# Patient Record
Sex: Female | Born: 1935 | Race: White | Hispanic: No | Smoking: Never smoker
Health system: Southern US, Community
[De-identification: ages and names within clinical notes are randomized; demographics above are authoritative.]

## PROBLEM LIST (undated history)

## (undated) DIAGNOSIS — I2510 Atherosclerotic heart disease of native coronary artery without angina pectoris: Secondary | ICD-10-CM

## (undated) DIAGNOSIS — E785 Hyperlipidemia, unspecified: Secondary | ICD-10-CM

## (undated) DIAGNOSIS — I214 Non-ST elevation (NSTEMI) myocardial infarction: Secondary | ICD-10-CM

## (undated) HISTORY — DX: Atherosclerotic heart disease of native coronary artery without angina pectoris: I25.10

## (undated) HISTORY — DX: Non-ST elevation (NSTEMI) myocardial infarction: I21.4

## (undated) HISTORY — DX: Hyperlipidemia, unspecified: E78.5

## (undated) HISTORY — PX: BLADDER SURGERY: SHX569

## (undated) HISTORY — PX: ABDOMINAL HYSTERECTOMY: SHX81

---

## 2014-05-18 DIAGNOSIS — H10023 Other mucopurulent conjunctivitis, bilateral: Secondary | ICD-10-CM | POA: Diagnosis not present

## 2015-02-08 DIAGNOSIS — H2513 Age-related nuclear cataract, bilateral: Secondary | ICD-10-CM | POA: Diagnosis not present

## 2016-02-08 DIAGNOSIS — L570 Actinic keratosis: Secondary | ICD-10-CM | POA: Diagnosis not present

## 2016-02-08 DIAGNOSIS — C44629 Squamous cell carcinoma of skin of left upper limb, including shoulder: Secondary | ICD-10-CM | POA: Diagnosis not present

## 2016-02-15 DIAGNOSIS — H2513 Age-related nuclear cataract, bilateral: Secondary | ICD-10-CM | POA: Diagnosis not present

## 2016-03-26 DIAGNOSIS — J189 Pneumonia, unspecified organism: Secondary | ICD-10-CM | POA: Diagnosis not present

## 2016-03-26 DIAGNOSIS — R062 Wheezing: Secondary | ICD-10-CM | POA: Diagnosis not present

## 2016-03-26 DIAGNOSIS — J0100 Acute maxillary sinusitis, unspecified: Secondary | ICD-10-CM | POA: Diagnosis not present

## 2016-03-26 DIAGNOSIS — R0602 Shortness of breath: Secondary | ICD-10-CM | POA: Diagnosis not present

## 2016-03-29 DIAGNOSIS — R001 Bradycardia, unspecified: Secondary | ICD-10-CM | POA: Diagnosis not present

## 2016-03-29 DIAGNOSIS — I340 Nonrheumatic mitral (valve) insufficiency: Secondary | ICD-10-CM | POA: Diagnosis not present

## 2016-03-29 DIAGNOSIS — I10 Essential (primary) hypertension: Secondary | ICD-10-CM | POA: Diagnosis not present

## 2016-03-29 DIAGNOSIS — I214 Non-ST elevation (NSTEMI) myocardial infarction: Secondary | ICD-10-CM | POA: Diagnosis not present

## 2016-03-29 DIAGNOSIS — Z885 Allergy status to narcotic agent status: Secondary | ICD-10-CM | POA: Diagnosis not present

## 2016-03-29 DIAGNOSIS — E785 Hyperlipidemia, unspecified: Secondary | ICD-10-CM | POA: Diagnosis not present

## 2016-03-29 DIAGNOSIS — K521 Toxic gastroenteritis and colitis: Secondary | ICD-10-CM | POA: Diagnosis not present

## 2016-03-29 DIAGNOSIS — Z85828 Personal history of other malignant neoplasm of skin: Secondary | ICD-10-CM | POA: Diagnosis not present

## 2016-03-29 DIAGNOSIS — Z79899 Other long term (current) drug therapy: Secondary | ICD-10-CM | POA: Diagnosis not present

## 2016-03-29 DIAGNOSIS — I119 Hypertensive heart disease without heart failure: Secondary | ICD-10-CM | POA: Diagnosis not present

## 2016-03-29 DIAGNOSIS — R740 Nonspecific elevation of levels of transaminase and lactic acid dehydrogenase [LDH]: Secondary | ICD-10-CM | POA: Diagnosis present

## 2016-03-29 DIAGNOSIS — R079 Chest pain, unspecified: Secondary | ICD-10-CM | POA: Diagnosis not present

## 2016-03-30 DIAGNOSIS — I10 Essential (primary) hypertension: Secondary | ICD-10-CM

## 2016-03-30 DIAGNOSIS — E785 Hyperlipidemia, unspecified: Secondary | ICD-10-CM

## 2016-03-31 DIAGNOSIS — I517 Cardiomegaly: Secondary | ICD-10-CM | POA: Diagnosis not present

## 2016-03-31 DIAGNOSIS — R42 Dizziness and giddiness: Secondary | ICD-10-CM | POA: Diagnosis not present

## 2016-03-31 DIAGNOSIS — J9811 Atelectasis: Secondary | ICD-10-CM | POA: Diagnosis not present

## 2016-03-31 DIAGNOSIS — I2510 Atherosclerotic heart disease of native coronary artery without angina pectoris: Secondary | ICD-10-CM | POA: Diagnosis not present

## 2016-03-31 DIAGNOSIS — E785 Hyperlipidemia, unspecified: Secondary | ICD-10-CM | POA: Diagnosis not present

## 2016-03-31 DIAGNOSIS — I119 Hypertensive heart disease without heart failure: Secondary | ICD-10-CM | POA: Diagnosis not present

## 2016-03-31 DIAGNOSIS — I252 Old myocardial infarction: Secondary | ICD-10-CM | POA: Diagnosis not present

## 2016-03-31 DIAGNOSIS — I440 Atrioventricular block, first degree: Secondary | ICD-10-CM | POA: Diagnosis not present

## 2016-03-31 DIAGNOSIS — J189 Pneumonia, unspecified organism: Secondary | ICD-10-CM | POA: Diagnosis not present

## 2016-03-31 DIAGNOSIS — R079 Chest pain, unspecified: Secondary | ICD-10-CM | POA: Diagnosis not present

## 2016-03-31 DIAGNOSIS — I10 Essential (primary) hypertension: Secondary | ICD-10-CM | POA: Diagnosis not present

## 2016-03-31 DIAGNOSIS — R9431 Abnormal electrocardiogram [ECG] [EKG]: Secondary | ICD-10-CM | POA: Diagnosis not present

## 2016-03-31 DIAGNOSIS — I214 Non-ST elevation (NSTEMI) myocardial infarction: Secondary | ICD-10-CM | POA: Diagnosis not present

## 2016-03-31 DIAGNOSIS — E784 Other hyperlipidemia: Secondary | ICD-10-CM | POA: Diagnosis not present

## 2016-03-31 DIAGNOSIS — I25118 Atherosclerotic heart disease of native coronary artery with other forms of angina pectoris: Secondary | ICD-10-CM | POA: Diagnosis not present

## 2016-03-31 DIAGNOSIS — I340 Nonrheumatic mitral (valve) insufficiency: Secondary | ICD-10-CM | POA: Diagnosis not present

## 2016-04-28 DIAGNOSIS — I2510 Atherosclerotic heart disease of native coronary artery without angina pectoris: Secondary | ICD-10-CM | POA: Diagnosis not present

## 2016-04-28 DIAGNOSIS — E785 Hyperlipidemia, unspecified: Secondary | ICD-10-CM | POA: Diagnosis not present

## 2016-04-28 DIAGNOSIS — I214 Non-ST elevation (NSTEMI) myocardial infarction: Secondary | ICD-10-CM | POA: Diagnosis not present

## 2016-06-15 DIAGNOSIS — H10021 Other mucopurulent conjunctivitis, right eye: Secondary | ICD-10-CM | POA: Diagnosis not present

## 2016-07-03 DIAGNOSIS — E785 Hyperlipidemia, unspecified: Secondary | ICD-10-CM | POA: Diagnosis not present

## 2016-07-03 DIAGNOSIS — I214 Non-ST elevation (NSTEMI) myocardial infarction: Secondary | ICD-10-CM | POA: Diagnosis not present

## 2016-07-03 DIAGNOSIS — I2510 Atherosclerotic heart disease of native coronary artery without angina pectoris: Secondary | ICD-10-CM | POA: Diagnosis not present

## 2016-07-05 DIAGNOSIS — I119 Hypertensive heart disease without heart failure: Secondary | ICD-10-CM | POA: Diagnosis not present

## 2016-07-05 DIAGNOSIS — E782 Mixed hyperlipidemia: Secondary | ICD-10-CM | POA: Diagnosis not present

## 2016-08-22 DIAGNOSIS — Z79899 Other long term (current) drug therapy: Secondary | ICD-10-CM | POA: Diagnosis not present

## 2016-08-22 DIAGNOSIS — I119 Hypertensive heart disease without heart failure: Secondary | ICD-10-CM | POA: Diagnosis not present

## 2016-08-22 DIAGNOSIS — E782 Mixed hyperlipidemia: Secondary | ICD-10-CM | POA: Diagnosis not present

## 2016-11-01 ENCOUNTER — Encounter: Payer: Self-pay | Admitting: Cardiology

## 2016-11-16 ENCOUNTER — Other Ambulatory Visit: Payer: Self-pay

## 2016-11-16 ENCOUNTER — Encounter: Payer: Self-pay | Admitting: Cardiology

## 2016-11-16 DIAGNOSIS — E785 Hyperlipidemia, unspecified: Secondary | ICD-10-CM

## 2016-11-16 DIAGNOSIS — I214 Non-ST elevation (NSTEMI) myocardial infarction: Secondary | ICD-10-CM

## 2016-11-16 DIAGNOSIS — I2510 Atherosclerotic heart disease of native coronary artery without angina pectoris: Secondary | ICD-10-CM

## 2016-11-16 DIAGNOSIS — I252 Old myocardial infarction: Secondary | ICD-10-CM | POA: Insufficient documentation

## 2016-11-16 DIAGNOSIS — I25110 Atherosclerotic heart disease of native coronary artery with unstable angina pectoris: Secondary | ICD-10-CM | POA: Insufficient documentation

## 2016-11-16 HISTORY — DX: Hyperlipidemia, unspecified: E78.5

## 2016-11-16 HISTORY — DX: Non-ST elevation (NSTEMI) myocardial infarction: I21.4

## 2016-11-16 HISTORY — DX: Atherosclerotic heart disease of native coronary artery without angina pectoris: I25.10

## 2016-11-20 ENCOUNTER — Encounter: Payer: Self-pay | Admitting: Cardiology

## 2016-11-20 ENCOUNTER — Ambulatory Visit (INDEPENDENT_AMBULATORY_CARE_PROVIDER_SITE_OTHER): Payer: Medicare Other | Admitting: Cardiology

## 2016-11-20 VITALS — BP 136/72 | HR 59 | Ht 62.0 in | Wt 135.0 lb

## 2016-11-20 DIAGNOSIS — E785 Hyperlipidemia, unspecified: Secondary | ICD-10-CM

## 2016-11-20 DIAGNOSIS — I25110 Atherosclerotic heart disease of native coronary artery with unstable angina pectoris: Secondary | ICD-10-CM

## 2016-11-20 DIAGNOSIS — I959 Hypotension, unspecified: Secondary | ICD-10-CM | POA: Diagnosis not present

## 2016-11-20 NOTE — Progress Notes (Signed)
Cardiology Office Note:    Date:  11/20/2016   ID:  Shannon Flowers [place], [place] 02/11/1927, MRN [id number]  PCP:  Melony Overly, MD  Cardiologist:  Shirlee More, MD    Referring MD: No ref. provider found    ASSESSMENT:    1. Coronary artery disease involving native coronary artery of native heart with unstable angina pectoris ([place])   2. Dyslipidemia   3. Hypotension, unspecified hypotension type    PLAN:    In order of problems listed above:  1. Stable clinically continue current treatment but unfortunately are not except dual antiplatelet therapy or statin treatment. I asked her she changes her mind to be in touch with me and offered to give her a low-dose low intensity statin 2 days a week to mitigate her concerns of side effects. 2. Poorly treated 3. Hypotension, resolved   Next appointment: 6 months   Medication Adjustments/Labs and Tests Ordered: Current medicines are reviewed at length with the patient today.  Concerns regarding medicines are outlined above.  Orders Placed This Encounter  Procedures  . EKG 12-Lead   No orders of the defined types were placed in this encounter.   Chief Complaint  Patient presents with  . Follow-up    Routine flup appt for CAD    History of Present Illness:    Shannon Flowers is a 83 y.o. female with a hx of CAD  hypertension and recent symptomatic hypotension ast seen within the last year. With blood pressure as low as 70 systolic her ACE inhibitor was stopped and she's had no recurrent hypotension or weakness. At that time she was having severe diarrhea. I discussed lipid-lowering therapy she adamantly refuses except pharmaceutical interventions. I asked her to consider dual antiplatelet therapy she declines that she is pleased with the quality of her life is having no angina shortness of breath palpitations syncope or TIA as lost her husband in the last month and seems to be somewhat fatalistic at this  time.. If symptomatic she reconsider these endeavors Compliance with diet, lifestyle and medications: Yes Past Medical History:  Diagnosis Date  . ASHD (arteriosclerotic heart disease) 11/16/2016  . Dyslipidemia 11/16/2016  . Non-ST elevation (NSTEMI) myocardial infarction [facility]) 11/16/2016    Past Surgical History:  Procedure Laterality Date  . ABDOMINAL HYSTERECTOMY    . CORONARY ANGIOPLASTY WITH STENT PLACEMENT      Current Medications: Current Meds  Medication Sig  . albuterol (PROAIR HFA) 108 (90 Base) MCG/ACT inhaler Inhale 1 puff into the lungs daily as needed for wheezing.   Marland Kitchen aspirin 81 MG chewable tablet Chew 81 mg by mouth daily.   . isosorbide mononitrate (IMDUR) 30 MG 24 hr tablet Take 30 mg by mouth daily.   . nitroGLYCERIN (NITROSTAT) 0.4 MG SL tablet Place 0.4 mg under the tongue every 5 (five) minutes as needed for chest pain.   . [DISCONTINUED] furosemide (LASIX) 20 MG tablet Take 20 mg by mouth 2 (two) times daily.  . [DISCONTINUED] metoprolol tartrate (LOPRESSOR) 25 MG tablet Take 25 mg by mouth 2 (two) times daily.     Allergies:   Codeine and Levofloxacin   Social History   Social History  . Marital status: Widowed    Spouse name: N/A  . Number of children: N/A  . Years of education: N/A   Social History Main Topics  . Smoking status: Never Smoker  . Smokeless tobacco: Never Used  . Alcohol use No  . Drug use: No  .  Sexual activity: Not Asked   Other Topics Concern  . None   Social History Narrative  . None     Family History: The patient's family history includes Stroke in her father. ROS:   Please see the history of present illness.    All other systems reviewed and are negative.  EKGs/Labs/Other Studies Reviewed:    The following studies were reviewed today:  EKG:  EKG ordered today.  The ekg ordered today demonstrates [place] stable pattern normal EKG  Recent Labs:Requested from her PCP No results found for requested labs within last  8760 hours.  Recent Lipid Panel No results found for: CHOL, TRIG, HDL, CHOLHDL, VLDL, LDLCALC, LDLDIRECT  Physical Exam:    VS:  BP 136/72   Pulse (!) 59   Ht 5\' 2"  (1.575 m)   Wt 135 lb (61.2 kg)   SpO2 97%   BMI 24.69 kg/m     Wt Readings from Last 3 Encounters:  11/20/16 135 lb (61.2 kg)     GEN:  Well nourished, well developed in no acute distress HEENT: Normal NECK: No JVD; No carotid bruits LYMPHATICS: No lymphadenopathy CARDIAC: RRR, no murmurs, rubs, gallops RESPIRATORY:  Clear to auscultation without rales, wheezing or rhonchi  ABDOMEN: Soft, non-tender, non-distended MUSCULOSKELETAL:  No edema; No deformity  SKIN: Warm and dry NEUROLOGIC:  Alert and oriented x 3 PSYCHIATRIC:  Normal affect    Signed, Shirlee More, MD  11/20/2016 1:00 PM    [facility]

## 2016-11-20 NOTE — Patient Instructions (Signed)

## 2016-11-30 DIAGNOSIS — Z0000 Encounter for general adult medical examination without abnormal findings: Secondary | ICD-10-CM | POA: Diagnosis not present

## 2016-11-30 DIAGNOSIS — E663 Overweight: Secondary | ICD-10-CM | POA: Diagnosis not present

## 2016-11-30 DIAGNOSIS — E785 Hyperlipidemia, unspecified: Secondary | ICD-10-CM | POA: Diagnosis not present

## 2016-11-30 DIAGNOSIS — I119 Hypertensive heart disease without heart failure: Secondary | ICD-10-CM | POA: Diagnosis not present

## 2016-11-30 DIAGNOSIS — Z136 Encounter for screening for cardiovascular disorders: Secondary | ICD-10-CM | POA: Diagnosis not present

## 2016-11-30 DIAGNOSIS — R739 Hyperglycemia, unspecified: Secondary | ICD-10-CM | POA: Diagnosis not present

## 2016-12-04 ENCOUNTER — Other Ambulatory Visit: Payer: Self-pay

## 2016-12-04 NOTE — Telephone Encounter (Signed)
Patient called to inform of seeing a new primary doctor and that he started the patient on levothyroxine and fenofibrate. Patient was concerned and wanted Dr. Joya Gaskins opinion. Advised that Dr. Bettina Gavia was out of the office this week. Looked up on UpToDate. Advised patient that there are no interactions between these medications and her current medications. Patient still concerned over why she was prescribed these medications. Advised patient that she would have to call the doctor's office who prescribed the medication as we could not speak to that. The patient preceded to state that she has concerns over this new doctor and is unsure if she trusts him. Advised patient she could get a second opinion and that she could also call the doctor's office and ask if the doctor mentioned in the note or if there was lab work drawn to indicate a reason for starting the new medications. Patient verbalized understanding.

## 2016-12-05 DIAGNOSIS — Z1231 Encounter for screening mammogram for malignant neoplasm of breast: Secondary | ICD-10-CM | POA: Diagnosis not present

## 2016-12-09 DIAGNOSIS — I252 Old myocardial infarction: Secondary | ICD-10-CM | POA: Diagnosis not present

## 2016-12-09 DIAGNOSIS — I2510 Atherosclerotic heart disease of native coronary artery without angina pectoris: Secondary | ICD-10-CM | POA: Diagnosis not present

## 2016-12-09 DIAGNOSIS — R0789 Other chest pain: Secondary | ICD-10-CM | POA: Diagnosis not present

## 2016-12-09 DIAGNOSIS — R079 Chest pain, unspecified: Secondary | ICD-10-CM | POA: Diagnosis not present

## 2017-01-02 DIAGNOSIS — L570 Actinic keratosis: Secondary | ICD-10-CM | POA: Diagnosis not present

## 2017-01-02 DIAGNOSIS — L821 Other seborrheic keratosis: Secondary | ICD-10-CM | POA: Diagnosis not present

## 2017-01-02 DIAGNOSIS — R233 Spontaneous ecchymoses: Secondary | ICD-10-CM | POA: Diagnosis not present

## 2017-03-13 DIAGNOSIS — H2513 Age-related nuclear cataract, bilateral: Secondary | ICD-10-CM | POA: Diagnosis not present

## 2017-03-13 DIAGNOSIS — E119 Type 2 diabetes mellitus without complications: Secondary | ICD-10-CM | POA: Diagnosis not present

## 2017-04-03 ENCOUNTER — Other Ambulatory Visit: Payer: Self-pay

## 2017-04-03 MED ORDER — ISOSORBIDE MONONITRATE ER 30 MG PO TB24: 30.0000 mg | ORAL_TABLET | Freq: Every day | ORAL | 3 refills | Status: DC

## 2017-05-22 NOTE — Progress Notes (Signed)
Cardiology Office Note:    Date:  05/23/2017   ID:  [place], [place] January 02, 1927, MRN [id number]  PCP:  No primary care provider on file.  Cardiologist:  Shirlee More, MD    Referring MD: Melony Overly, MD    ASSESSMENT:    1. Coronary artery disease involving native coronary artery of native heart with unstable angina pectoris ([place])   2. Dyslipidemia   3. Other specified hypotension    PLAN:    In order of problems listed above:  1. Stable course she will continue medical therapy of aspirin beta-blocker and after long discussion he agrees to accept lipid-lowering therapy but will not take a statin and initiate treatment with Zetia.  Baseline liver profile CMP checked today.  I have reviewed her previous records at the time of coronary angiography in November 2018 she had multivessel CAD and was judged to be a poor surgical candidate and advised medical therapy.  At this time I would not change our approach.  We will consider a follow-up ischemia evaluation at the time of her next visit. 2. Initiate lipid-lowering therapy 3. No clinical recurrence   Next appointment: 6 months   Medication Adjustments/Labs and Tests Ordered: Current medicines are reviewed at length with the patient today.  Concerns regarding medicines are outlined above.  Orders Placed This Encounter  Procedures  . CBC w/Diff/Platelet  . Comprehensive Metabolic Panel (CMET)  . Lipid Profile  . EKG 12-Lead   Meds ordered this encounter  Medications  . ezetimibe (ZETIA) 10 MG tablet    Sig: Take 1 tablet (10 mg total) by mouth daily.    Dispense:  30 tablet    Refill:  3    Chief Complaint  Patient presents with  . Follow-up    History of Present Illness:   CAD, hypertension, hyperlipidemia and hypotension Shannon Flowers is a 84 y.o. female with a hx of CAD and non STEMI November 2018, hypertension, hyperlipidemia and hypotension last seen in July 2018. 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  She is pleased with the quality of her life compliant with her medications but has a strong personal belief that she cannot take a statin she refuses even a low-dose of a low intensity statin 2 days a week but after discussion of the benefits of lipid-lowering therapy agrees to take Zetia. Past Medical History:  Diagnosis Date  . ASHD (arteriosclerotic heart disease) 11/16/2016  . Dyslipidemia 11/16/2016  . Non-ST elevation (NSTEMI) myocardial infarction [facility]) 11/16/2016    Past Surgical History:  Procedure Laterality Date  . ABDOMINAL HYSTERECTOMY    . CORONARY ANGIOPLASTY WITH STENT PLACEMENT      Current Medications: Current Meds  Medication Sig  . albuterol (PROAIR HFA) 108 (90 Base) MCG/ACT inhaler Inhale 1 puff into the lungs daily as needed for wheezing.   Marland Kitchen aspirin 81 MG chewable tablet Chew 81 mg by mouth daily.   . carvedilol (COREG) 6.25 MG tablet Take 6.25 mg by mouth 2 (two) times daily.   . isosorbide mononitrate (IMDUR) 30 MG 24 hr tablet Take 1 tablet (30 mg total) by mouth daily.  . nitroGLYCERIN (NITROSTAT) 0.4 MG SL tablet Place 0.4 mg under the  tongue every 5 (five) minutes as needed for chest pain.      Allergies:   Codeine and Levofloxacin   Social History   Socioeconomic History  . Marital status: Widowed    Spouse name: None  . Number of children: None  . Years of education: None  . Highest education level: None  Social Needs  . Financial resource strain: None  . Food insecurity - worry: None  . Food insecurity - inability: None  . Transportation needs - medical: None  . Transportation needs - non-medical: None  Occupational History  . None  Tobacco  Use  . Smoking status: Never Smoker  . Smokeless tobacco: Never Used  Substance and Sexual Activity  . Alcohol use: No  . Drug use: No  . Sexual activity: None  Other Topics Concern  . None  Social History Narrative  . None     Family History: The patient's family history includes Stroke in her father. ROS:   Please see the history of present illness.    All other systems reviewed and are negative.  EKGs/Labs/Other Studies Reviewed:    The following studies were reviewed today:  EKG:  EKG ordered today.  The ekg ordered today demonstrates sinus rhythm left anterior hemiblock  Recent Labs: No results found for requested labs within last 8760 hours.  Recent Lipid Panel No results found for: CHOL, TRIG, HDL, CHOLHDL, VLDL, LDLCALC, LDLDIRECT  Physical Exam:    VS:  BP 116/60   Pulse (!) 56   Ht 5\' 2"  (1.575 m)   Wt 131 lb 12.8 oz (59.8 kg)   SpO2 96%   BMI 24.11 kg/m     Wt Readings from Last 3 Encounters:  05/23/17 131 lb 12.8 oz (59.8 kg)  11/20/16 135 lb (61.2 kg)     GEN: She appears her age  well nourished, well developed in no acute distress HEENT: Normal NECK: No JVD; No carotid bruits LYMPHATICS: No lymphadenopathy CARDIAC:  RRR, no murmurs, rubs, gallops RESPIRATORY:  Clear to auscultation without rales, wheezing or rhonchi  ABDOMEN: Soft, non-tender, non-distended MUSCULOSKELETAL:  No edema; No deformity  SKIN: Warm and dry NEUROLOGIC:  Alert and oriented x 3 PSYCHIATRIC:  Normal affect    Signed, Shirlee More, MD  05/23/2017 12:48 PM    [facility]

## 2017-05-23 ENCOUNTER — Encounter: Payer: Self-pay | Admitting: Cardiology

## 2017-05-23 ENCOUNTER — Ambulatory Visit (INDEPENDENT_AMBULATORY_CARE_PROVIDER_SITE_OTHER): Payer: Medicare Other | Admitting: Cardiology

## 2017-05-23 VITALS — BP 116/60 | HR 56 | Ht 62.0 in | Wt 131.8 lb

## 2017-05-23 DIAGNOSIS — I25110 Atherosclerotic heart disease of native coronary artery with unstable angina pectoris: Secondary | ICD-10-CM | POA: Diagnosis not present

## 2017-05-23 DIAGNOSIS — I9589 Other hypotension: Secondary | ICD-10-CM | POA: Diagnosis not present

## 2017-05-23 DIAGNOSIS — E785 Hyperlipidemia, unspecified: Secondary | ICD-10-CM

## 2017-05-23 MED ORDER — EZETIMIBE 10 MG PO TABS: 10.0000 mg | ORAL_TABLET | Freq: Every day | ORAL | 3 refills | Status: DC

## 2017-05-23 NOTE — Patient Instructions (Signed)
Medication Instructions:  Your physician has recommended you make the following change in your medication:  START zetia 10 mg daily  Labwork: Your physician recommends that you have the following labs drawn: lipids, CMP and CBC  Testing/Procedures: None  Follow-Up: Your physician recommends that you schedule a follow-up appointment in: 6 months  Any Other Special Instructions Will Be Listed Below (If Applicable).     If you need a refill on your cardiac medications before your next appointment, please call your pharmacy.   [place], RN, BSN   Ezetimibe Tablets What is this medicine? EZETIMIBE (ez ET i mibe) blocks the absorption of cholesterol from the stomach. It can help lower blood cholesterol for patients who are at risk of getting heart disease or a stroke. It is only for patients whose cholesterol level is not controlled by diet. This medicine may be used for other purposes; ask your health care provider or pharmacist if you have questions. COMMON BRAND NAME(S): Zetia What should I tell my health care provider before I take this medicine? They need to know if you have any of these conditions: -liver disease -an unusual or allergic reaction to ezetimibe, medicines, foods, dyes, or preservatives -pregnant or trying to get pregnant -breast-feeding How should I use this medicine? Take this medicine by mouth with a glass of water. Follow the directions on the prescription label. This medicine can be taken with or without food. Take your doses at regular intervals. Do not take your medicine more often than directed. Talk to your pediatrician regarding the use of this medicine in children. Special care may be needed. Overdosage: If you think you have taken too much of this medicine contact a poison control center or emergency room at once. NOTE: This medicine is only for you. Do not share this medicine with others. What if I miss a dose? If you miss a dose,  take it as soon as you can. If it is almost time for your next dose, take only that dose. Do not take double or extra doses. What may interact with this medicine? Do not take this medicine with any of the following medications: -fenofibrate -gemfibrozil This medicine may also interact with the following medications: -antacids -cyclosporine -herbal medicines like red yeast rice -other medicines to lower cholesterol or triglycerides This list may not describe all possible interactions. Give your health care provider a list of all the medicines, herbs, non-prescription drugs, or dietary supplements you use. Also tell them if you smoke, drink alcohol, or use illegal drugs. Some items may interact with your medicine. What should I watch for while using this medicine? Visit your doctor or health care professional for regular checks on your progress. You will need to have your cholesterol levels checked. If you are also taking some other cholesterol medicines, you will also need to have tests to make sure your liver is working properly. Tell your doctor or health care professional if you get any unexplained muscle pain, tenderness, or weakness, especially if you also have a fever and tiredness. You need to follow a low-cholesterol, low-fat diet while you are taking this medicine. This will decrease your risk of getting heart and blood vessel disease. Exercising and avoiding alcohol and smoking can also help. Ask your doctor or dietician for advice. What side effects may I notice from receiving this medicine? Side effects that you should report to your doctor or health care professional as soon as possible: -allergic reactions like skin rash,  itching or hives, swelling of the face, lips, or tongue -dark yellow or brown urine -unusually weak or tired -yellowing of the skin or eyes Side effects that usually do not require medical attention (report to your doctor or health care professional if they  continue or are bothersome): -diarrhea -dizziness -headache -stomach upset or pain This list may not describe all possible side effects. Call your doctor for medical advice about side effects. You may report side effects to FDA at 1-800-FDA-1088. Where should I keep my medicine? Keep out of the reach of children. Store at room temperature between 15 and 30 degrees C (59 and 86 degrees F). Protect from moisture. Keep container tightly closed. Throw away any unused medicine after the expiration date. NOTE: This sheet is a summary. It may not cover all possible information. If you have questions about this medicine, talk to your doctor, pharmacist, or health care provider.  2018 Elsevier/Gold Standard (2011-10-30 15:39:09)

## 2017-05-24 LAB — COMPREHENSIVE METABOLIC PANEL
ALT: 13 IU/L (ref 0–32)
AST: 20 IU/L (ref 0–40)
Albumin/Globulin Ratio: 1.7 (ref 1.2–2.2)
Albumin: 4.5 g/dL (ref 3.5–4.7)
Alkaline Phosphatase: 55 IU/L (ref 39–117)
BILIRUBIN TOTAL: 0.7 mg/dL (ref 0.0–1.2)
BUN / CREAT RATIO: 24 (ref 12–28)
BUN: 20 mg/dL (ref 8–27)
CO2: 25 mmol/L (ref 20–29)
CREATININE: 0.82 mg/dL (ref 0.57–1.00)
Calcium: 9.9 mg/dL (ref 8.7–10.3)
Chloride: 102 mmol/L (ref 96–106)
GFR, EST AFRICAN AMERICAN: 74 mL/min/{1.73_m2} (ref >59)
GFR, EST NON AFRICAN AMERICAN: 64 mL/min/{1.73_m2} (ref >59)
GLUCOSE: 109 mg/dL — AB (ref 65–99)
Globulin, Total: 2.6 g/dL (ref 1.5–4.5)
Potassium: 4.6 mmol/L (ref 3.5–5.2)
Sodium: 139 mmol/L (ref 134–144)
TOTAL PROTEIN: 7.1 g/dL (ref 6.0–8.5)

## 2017-05-24 LAB — CBC WITH DIFFERENTIAL/PLATELET
BASOS ABS: 0 10*3/uL (ref 0.0–0.2)
BASOS: 0 %
EOS (ABSOLUTE): 0.2 10*3/uL (ref 0.0–0.4)
Eos: 3 %
Hematocrit: 41.7 % (ref 34.0–46.6)
Hemoglobin: 14.1 g/dL (ref 11.1–15.9)
IMMATURE GRANS (ABS): 0 10*3/uL (ref 0.0–0.1)
Immature Granulocytes: 0 %
Lymphocytes Absolute: 2.5 10*3/uL (ref 0.7–3.1)
Lymphs: 40 %
MCH: 31.6 pg (ref 26.6–33.0)
MCHC: 33.8 g/dL (ref 31.5–35.7)
MCV: 94 fL (ref 79–97)
MONOCYTES: 13 %
Monocytes Absolute: 0.8 10*3/uL (ref 0.1–0.9)
NEUTROS ABS: 2.8 10*3/uL (ref 1.4–7.0)
Neutrophils: 44 %
Platelets: 283 10*3/uL (ref 150–379)
RBC: 4.46 x10E6/uL (ref 3.77–5.28)
RDW: 15.2 % (ref 12.3–15.4)
WBC: 6.2 10*3/uL (ref 3.4–10.8)

## 2017-05-24 LAB — LIPID PANEL
CHOL/HDL RATIO: 4.8 ratio — AB (ref 0.0–4.4)
Cholesterol, Total: 169 mg/dL (ref 100–199)
HDL: 35 mg/dL — ABNORMAL LOW (ref >39)
LDL CALC: 82 mg/dL (ref 0–99)
Triglycerides: 261 mg/dL — ABNORMAL HIGH (ref 0–149)
VLDL CHOLESTEROL CAL: 52 mg/dL — AB (ref 5–40)

## 2017-06-05 ENCOUNTER — Other Ambulatory Visit: Payer: Self-pay

## 2017-06-05 MED ORDER — CARVEDILOL 6.25 MG PO TABS: 6.2500 mg | ORAL_TABLET | Freq: Two times a day (BID) | ORAL | 3 refills | Status: DC

## 2017-07-18 DIAGNOSIS — R509 Fever, unspecified: Secondary | ICD-10-CM | POA: Diagnosis not present

## 2017-07-18 DIAGNOSIS — J111 Influenza due to unidentified influenza virus with other respiratory manifestations: Secondary | ICD-10-CM | POA: Diagnosis not present

## 2017-07-18 DIAGNOSIS — M791 Myalgia, unspecified site: Secondary | ICD-10-CM | POA: Diagnosis not present

## 2017-08-07 ENCOUNTER — Emergency Department (HOSPITAL_BASED_OUTPATIENT_CLINIC_OR_DEPARTMENT_OTHER)
Admission: EM | Admit: 2017-08-07 | Discharge: 2017-08-07 | Disposition: A | Discharge status: Home / Self Care | Payer: Medicare Other | Attending: Emergency Medicine | Admitting: Emergency Medicine

## 2017-08-07 ENCOUNTER — Other Ambulatory Visit: Payer: Self-pay

## 2017-08-07 ENCOUNTER — Emergency Department (HOSPITAL_BASED_OUTPATIENT_CLINIC_OR_DEPARTMENT_OTHER): Payer: Medicare Other

## 2017-08-07 ENCOUNTER — Telehealth: Payer: Self-pay

## 2017-08-07 ENCOUNTER — Encounter (HOSPITAL_BASED_OUTPATIENT_CLINIC_OR_DEPARTMENT_OTHER): Payer: Self-pay | Admitting: *Deleted

## 2017-08-07 ENCOUNTER — Ambulatory Visit (HOSPITAL_BASED_OUTPATIENT_CLINIC_OR_DEPARTMENT_OTHER): Admission: RE | Admit: 2017-08-07 | Payer: Medicare Other | Source: Ambulatory Visit

## 2017-08-07 DIAGNOSIS — R1110 Vomiting, unspecified: Secondary | ICD-10-CM | POA: Diagnosis not present

## 2017-08-07 DIAGNOSIS — W540XXA Bitten by dog, initial encounter: Secondary | ICD-10-CM | POA: Insufficient documentation

## 2017-08-07 DIAGNOSIS — Y9389 Activity, other specified: Secondary | ICD-10-CM | POA: Diagnosis not present

## 2017-08-07 DIAGNOSIS — R079 Chest pain, unspecified: Secondary | ICD-10-CM | POA: Diagnosis not present

## 2017-08-07 DIAGNOSIS — R072 Precordial pain: Secondary | ICD-10-CM | POA: Diagnosis not present

## 2017-08-07 DIAGNOSIS — Y999 Unspecified external cause status: Secondary | ICD-10-CM | POA: Diagnosis not present

## 2017-08-07 DIAGNOSIS — Y929 Unspecified place or not applicable: Secondary | ICD-10-CM | POA: Diagnosis not present

## 2017-08-07 DIAGNOSIS — Z7982 Long term (current) use of aspirin: Secondary | ICD-10-CM | POA: Insufficient documentation

## 2017-08-07 DIAGNOSIS — S60512A Abrasion of left hand, initial encounter: Secondary | ICD-10-CM | POA: Insufficient documentation

## 2017-08-07 DIAGNOSIS — I2510 Atherosclerotic heart disease of native coronary artery without angina pectoris: Secondary | ICD-10-CM | POA: Diagnosis not present

## 2017-08-07 DIAGNOSIS — S6992XA Unspecified injury of left wrist, hand and finger(s), initial encounter: Secondary | ICD-10-CM | POA: Diagnosis present

## 2017-08-07 DIAGNOSIS — Z79899 Other long term (current) drug therapy: Secondary | ICD-10-CM | POA: Insufficient documentation

## 2017-08-07 DIAGNOSIS — R0789 Other chest pain: Secondary | ICD-10-CM | POA: Diagnosis not present

## 2017-08-07 DIAGNOSIS — R197 Diarrhea, unspecified: Secondary | ICD-10-CM | POA: Diagnosis not present

## 2017-08-07 LAB — BASIC METABOLIC PANEL
Anion gap: 9 (ref 5–15)
BUN: 25 mg/dL — AB (ref 6–20)
CHLORIDE: 106 mmol/L (ref 101–111)
CO2: 21 mmol/L — AB (ref 22–32)
CREATININE: 0.88 mg/dL (ref 0.44–1.00)
Calcium: 9.3 mg/dL (ref 8.9–10.3)
GFR calc Af Amer: >60 mL/min (ref >60)
GFR calc non Af Amer: 57 mL/min — ABNORMAL LOW (ref >60)
Glucose, Bld: 167 mg/dL — ABNORMAL HIGH (ref 65–99)
Potassium: 3.9 mmol/L (ref 3.5–5.1)
Sodium: 136 mmol/L (ref 135–145)

## 2017-08-07 LAB — CBC WITH DIFFERENTIAL/PLATELET
Basophils Absolute: 0 10*3/uL (ref 0.0–0.1)
Basophils Relative: 0 %
EOS ABS: 0.1 10*3/uL (ref 0.0–0.7)
EOS PCT: 2 %
HCT: 39.8 % (ref 36.0–46.0)
Hemoglobin: 13.8 g/dL (ref 12.0–15.0)
LYMPHS ABS: 1.7 10*3/uL (ref 0.7–4.0)
Lymphocytes Relative: 21 %
MCH: 32.5 pg (ref 26.0–34.0)
MCHC: 34.7 g/dL (ref 30.0–36.0)
MCV: 93.9 fL (ref 78.0–100.0)
MONO ABS: 0.7 10*3/uL (ref 0.1–1.0)
MONOS PCT: 9 %
Neutro Abs: 5.7 10*3/uL (ref 1.7–7.7)
Neutrophils Relative %: 68 %
PLATELETS: 267 10*3/uL (ref 150–400)
RBC: 4.24 MIL/uL (ref 3.87–5.11)
RDW: 14.2 % (ref 11.5–15.5)
WBC: 8.3 10*3/uL (ref 4.0–10.5)

## 2017-08-07 LAB — TROPONIN I: Troponin I: <0.03 ng/mL (ref <0.03)

## 2017-08-07 NOTE — Discharge Instructions (Signed)
You were evaluated in the emergency department for a bad feeling in your chest.  We ran an EKG and x-ray and lab work and did not find an obvious cause of your symptoms.  You should continue your regular medications and follow-up with your doctor.  If you have any concerns or worsening symptoms she should return to the emergency department.

## 2017-08-07 NOTE — ED Provider Notes (Signed)
[place] EMERGENCY DEPARTMENT Provider Note   CSN: [id number] Arrival date & time: 08/07/17  1750     History   Chief Complaint Chief Complaint  Patient presents with  . Chest Pain    HPI Shannon Flowers is a 84 y.o. female.  She is here with a complaint of some chest discomfort that started about 3 PM.  It began at 10 AM when she was bit in the left hand by her dog.  She states that caused her to be very upset throughout the day.  Around 3 PM she said she felt unwell in her chest.  She checked her blood pressure and found it to be 77/40.  Later her blood pressure improved somewhat and they talked to a nurse who instructed her to try nitroglycerin.  She is not sure if the nitroglycerin helped but the discomfort has seemed to resolve and lasted about an hour.  She never called it pain and she is clear on that.  She does state however that the last time she had a heart attack she had a similar sensation that was never pain.  She said that was about 2 years ago.  There was no shortness of breath or sweatiness no nausea.  She is not having any discomfort right now.  As far as the dog bite goes is complaining of minimal pain at the site, she states her tetanus is up-to-date and the dog is up-to-date on his immunizations.  She cleaned the area with peroxide and put a dressing on it.  The history is provided by the patient.  Chest Pain   This is a new problem. The current episode started 3 to 5 hours ago. The problem occurs constantly. The problem has been resolved. The pain is associated with an emotional upset. The pain is present in the substernal region. The patient is experiencing no pain (uncomfortable feeling). The pain does not radiate. Duration of episode(s) is 1 hour. Associated symptoms include vomiting. Pertinent negatives include no abdominal pain, no back pain, no cough, no diaphoresis, no fever, no headaches, no hemoptysis, no nausea, no palpitations and no  shortness of breath. She has tried nitroglycerin for the symptoms. Improvement on treatment: unsure. Risk factors include being elderly.  Her past medical history is significant for CAD.  Pertinent negatives for past medical history include no seizures.    Past Medical History:  Diagnosis Date  . ASHD (arteriosclerotic heart disease) 11/16/2016  . Dyslipidemia 11/16/2016  . Non-ST elevation (NSTEMI) myocardial infarction [facility] [place]) 11/16/2016    Patient Active Problem List   Diagnosis Date Noted  . Hypotension 11/20/2016  . Coronary artery disease involving native coronary artery of native heart with unstable angina pectoris ([place]) 11/16/2016  . Dyslipidemia 11/16/2016  . Old MI (myocardial infarction) 11/16/2016    Past Surgical History:  Procedure Laterality Date  . ABDOMINAL HYSTERECTOMY       OB History   None      Home Medications    Prior to Admission medications   Medication Sig Start Date End Date Taking? Authorizing Provider  albuterol (PROAIR HFA) 108 (90 Base) MCG/ACT inhaler Inhale 1 puff into the lungs daily as needed for wheezing.  03/26/16   [provider]  aspirin 81 MG chewable tablet Chew 81 mg by mouth daily.  04/01/16   [provider]  carvedilol (COREG) 6.25 MG tablet Take 1 tablet (6.25 mg total) by mouth 2 (two) times daily. 06/05/17   Richardo Priest,  MD  ezetimibe (ZETIA) 10 MG tablet Take 1 tablet (10 mg total) by mouth daily. 05/23/17   Richardo Priest, MD  isosorbide mononitrate (IMDUR) 30 MG 24 hr tablet Take 1 tablet (30 mg total) by mouth daily. 04/03/17 04/03/18  Richardo Priest, MD  nitroGLYCERIN (NITROSTAT) 0.4 MG SL tablet Place 0.4 mg under the tongue every 5 (five) minutes as needed for chest pain.  04/01/16 05/23/18  [provider]    Family History Family History  Problem Relation Age of Onset  . Stroke Father     Social History Social History   Tobacco Use  . Smoking status: Never Smoker  . Smokeless  tobacco: Never Used  Substance Use Topics  . Alcohol use: No  . Drug use: No     Allergies   Codeine and Levofloxacin   Review of Systems Review of Systems  Constitutional: Negative for chills, diaphoresis and fever.  HENT: Negative for ear pain and sore throat.   Eyes: Negative for pain and visual disturbance.  Respiratory: Negative for cough, hemoptysis and shortness of breath.   Cardiovascular: Positive for chest pain. Negative for palpitations.  Gastrointestinal: Positive for diarrhea and vomiting. Negative for abdominal pain and nausea.  Genitourinary: Negative for dysuria and hematuria.  Musculoskeletal: Negative for arthralgias and back pain.  Skin: Positive for wound. Negative for color change and rash.  Neurological: Negative for seizures, syncope and headaches.  All other systems reviewed and are negative.    Physical Exam Updated Vital Signs BP (!) 161/74 (BP Location: Left Arm)   Pulse (!) 52   Temp (!) 97.5 F (36.4 C) (Oral)   Resp 18   Ht 5\' 2"  (1.575 m)   Wt 59 kg (130 lb)   SpO2 99%   BMI 23.78 kg/m   Physical Exam  Constitutional: She appears well-developed and well-nourished. No distress.  HENT:  Head: Normocephalic and atraumatic.  Mouth/Throat: Oropharynx is clear and moist.  Eyes: Conjunctivae are normal.  Neck: Neck supple.  Cardiovascular: Normal rate, regular rhythm and intact distal pulses.  No murmur heard. Pulmonary/Chest: Effort normal and breath sounds normal. No stridor. No respiratory distress. She has no wheezes.  Abdominal: Soft. There is no tenderness.  Musculoskeletal: Normal range of motion. She exhibits no edema or tenderness.  Neurological: She is alert. GCS eye subscore is 4. GCS verbal subscore is 5. GCS motor subscore is 6.  Skin: Skin is warm and dry.  Left hand is got some ecchymosis and a few small abrasions over the dorsum of her hand.  There is no obvious bony tenderness.  Psychiatric: She has a normal mood and  affect.  Nursing note and vitals reviewed.    ED Treatments / Results  Labs (all labs ordered are listed, but only abnormal results are displayed) Labs Reviewed  BASIC METABOLIC PANEL - Abnormal; Notable for the following components:      Result Value   CO2 21 (*)    Glucose, Bld 167 (*)    BUN 25 (*)    GFR calc non Af Amer 57 (*)    All other components within normal limits  CBC WITH DIFFERENTIAL/PLATELET  TROPONIN I  TROPONIN I    EKG EKG Interpretation  Date/Time:  Tuesday August 07 2017 17:56:41 EDT Ventricular Rate:  54 PR Interval:    QRS Duration: 81 QT Interval:  386 QTC Calculation: 366 R Axis:   -19 Text Interpretation:  Sinus rhythm Prolonged PR interval LVH with secondary repolarization abnormality  Inferior infarct, old Anterior Q waves, possibly due to LVH nonspecific st/ts no prior to compare with Confirmed by Aletta Edouard [phone]) on 08/07/2017 6:08:25 PM   Radiology Dg Chest 2 View  Result Date: 08/07/2017 CLINICAL DATA:  Chest pain EXAM: CHEST - 2 VIEW COMPARISON:  12/09/2016 chest radiograph. FINDINGS: Stable cardiomediastinal silhouette with normal heart size. No pneumothorax. No pleural effusion. Minimal left basilar scarring versus atelectasis. No pulmonary edema. No acute consolidative airspace disease. IMPRESSION: Minimal left basilar scarring versus atelectasis. Electronically Signed   By: Ilona Sorrel M.D.   On: 08/07/2017 19:05    Procedures Procedures (including critical care time)  Medications Ordered in ED Medications - No data to display   Initial Impression / Assessment and Plan / ED Course  I have reviewed the triage vital signs and the nursing notes.  Pertinent labs & imaging results that were available during my care of the patient were reviewed by me and considered in my medical decision making (see chart for details).  Clinical Course as of Aug 08 1109  Tue Aug 07, 2017  2023 Reevaluated patient.  She states she is not having  any discomfort of any kind and she wants to go home.  She is willing to stick around for a second troponin.   [MB]  2157 Will return if any worsening symptoms.Repeat troponin also negative.  Patient is comfortable going home and will follow up with her doctor.   [MB]    Clinical Course User Index [MB] Hayden Rasmussen, MD     Final Clinical Impressions(s) / ED Diagnoses   Final diagnoses:  Atypical chest pain    ED Discharge Orders    None       Hayden Rasmussen, MD 08/08/17 [phone]

## 2017-08-07 NOTE — ED Triage Notes (Signed)
Pt reports chest 'discomfort like when I had a heart attack' that started at 3am. Reports nausea, denies SOB. denies radiation of pain. Reports that her BP dropped to '77/40'

## 2017-08-07 NOTE — ED Notes (Signed)
Per radiology tech pt refused hand xrays.

## 2017-08-07 NOTE — Telephone Encounter (Signed)
Patient and family called in stating that the patient was having active chest pain, nausea, and dizziness. Per the patient her symptoms are mimicking her past symptoms associated with a coronary event. The patient was addiment about not going to an ED. It was explained to the patient that it is imperative that she seek emergency treatment as the symptoms are concerning. The daughter stated that she took nitro and this alleviated her symptoms slightly. The family and the patient were agreeable to seek emergency treatment for her chest pain.

## 2017-10-02 DIAGNOSIS — L570 Actinic keratosis: Secondary | ICD-10-CM | POA: Diagnosis not present

## 2017-10-02 DIAGNOSIS — C44612 Basal cell carcinoma of skin of right upper limb, including shoulder: Secondary | ICD-10-CM | POA: Diagnosis not present

## 2017-11-01 ENCOUNTER — Other Ambulatory Visit: Payer: Self-pay

## 2017-11-01 DIAGNOSIS — E785 Hyperlipidemia, unspecified: Secondary | ICD-10-CM

## 2017-11-01 MED ORDER — EZETIMIBE 10 MG PO TABS: 10.0000 mg | ORAL_TABLET | Freq: Every day | ORAL | 1 refills | Status: DC

## 2017-11-17 NOTE — Progress Notes (Signed)
Cardiology Office Note:    Date:  11/19/2017   ID:  Shannon Flowers [place], [place] Aug 29, 1926, MRN [id number]  PCP:  Charlynn Court, NP  Cardiologist:  Shirlee More, MD    Referring MD: No ref. provider found    ASSESSMENT:    1. Coronary artery disease involving native coronary artery of native heart with unstable angina pectoris ([place])   2. Dyslipidemia    PLAN:    In order of problems listed above:  1. Stable continue current medical treatment.  At this time I would not advise an ischemia evaluation 2. Continue current treatment with Zetia I think this is suboptimal but the patient will not accept other lipid-lowering treatment.   Next appointment: 6 months   Medication Adjustments/Labs and Tests Ordered: Current medicines are reviewed at length with the patient today.  Concerns regarding medicines are outlined above.  No orders of the defined types were placed in this encounter.  No orders of the defined types were placed in this encounter.   Chief Complaint  Patient presents with  . Coronary Artery Disease  . Hyperlipidemia    History of Present Illness:    Shannon Flowers is a 84 y.o. female with a hx of coronary artery disease non-ST elevation MI November 2017  hypertension hyperlipidemia and hypotension last seen 05/23/17.  She was seen [facility] ED 08/07/2017 with chest pain EKG normal 2 troponin normal and discharged home care Compliance with diet, lifestyle and medications:   Overall she is quite pleased with the quality of her life she does garden work is not having angina dyspnea palpitation or syncope.  She has had no intolerance to statins personally but she refuses to take either statin or injectable PCSK9.  With elevated triglycerides I offered VASCEPA and she declined.  At this time I would continue current medical treatment her coronary disease was not amenable to revascularization I would not do an ischemia evaluation at this  time. Past Medical History:  Diagnosis Date  . ASHD (arteriosclerotic heart disease) 11/16/2016  . Dyslipidemia 11/16/2016  . Non-ST elevation (NSTEMI) myocardial infarction [facility]) 11/16/2016    Past Surgical History:  Procedure Laterality Date  . ABDOMINAL HYSTERECTOMY      Current Medications: Current Meds  Medication Sig  . albuterol (PROAIR HFA) 108 (90 Base) MCG/ACT inhaler Inhale 1 puff into the lungs daily as needed for wheezing.   Marland Kitchen aspirin 81 MG chewable tablet Chew 81 mg by mouth daily.   . carvedilol (COREG) 6.25 MG tablet Take 1 tablet (6.25 mg total) by mouth 2 (two) times daily.  Marland Kitchen ezetimibe (ZETIA) 10 MG tablet Take 1 tablet (10 mg total) by mouth daily.  . isosorbide mononitrate (IMDUR) 30 MG 24 hr tablet Take 1 tablet (30 mg total) by mouth daily.  . nitroGLYCERIN (NITROSTAT) 0.4 MG SL tablet Place 0.4 mg under the tongue every 5 (five) minutes as needed for chest pain.      Allergies:   Codeine and Levofloxacin   Social History   Socioeconomic History  . Marital status: Widowed    Spouse name: Not on file  . Number of children: Not on file  . Years of education: Not on file  . Highest education level: Not on file  Occupational History  . Not on file  Social Needs  . Financial resource strain: Not on file  . Food insecurity:    Worry: Not on file    Inability: Not on file  .  Transportation needs:    Medical: Not on file    Non-medical: Not on file  Tobacco Use  . Smoking status: Never Smoker  . Smokeless tobacco: Never Used  Substance and Sexual Activity  . Alcohol use: No  . Drug use: No  . Sexual activity: Not on file  Lifestyle  . Physical activity:    Days per week: Not on file    Minutes per session: Not on file  . Stress: Not on file  Relationships  . Social connections:    Talks on phone: Not on file    Gets together: Not on file    Attends religious service: Not on file    Active member of club or organization: Not on file    Attends  meetings of clubs or organizations: Not on file    Relationship status: Not on file  Other Topics Concern  . Not on file  Social History Narrative  . Not on file     Family History: The patient's family history includes Stroke in her father. ROS:   Please see the history of present illness.    All other systems reviewed and are negative.  EKGs/Labs/Other Studies Reviewed:    The following studies were reviewed today  Recent Labs: 05/23/2017: ALT 13 08/07/2017: BUN 25; Creatinine, Ser 0.88; Hemoglobin 13.8; Platelets 267; Potassium 3.9; Sodium 136  Recent Lipid Panel    Component Value Date/Time   CHOL 169 05/23/2017 1024   TRIG 261 (H) 05/23/2017 1024   HDL 35 (L) 05/23/2017 1024   CHOLHDL 4.8 (H) 05/23/2017 1024   LDLCALC 82 05/23/2017 1024    Physical Exam:    VS:  BP (!) 126/54 (BP Location: Right Arm, Patient Position: Sitting, Cuff Size: Normal)   Pulse (!) 54   Ht 5\' 2"  (1.575 m)   Wt 128 lb 9.6 oz (58.3 kg)   SpO2 97%   BMI 23.52 kg/m     Wt Readings from Last 3 Encounters:  11/19/17 128 lb 9.6 oz (58.3 kg)  08/07/17 130 lb (59 kg)  05/23/17 131 lb 12.8 oz (59.8 kg)     GEN:  Well nourished, well developed in no acute distress HEENT: Normal NECK: No JVD; No carotid bruits LYMPHATICS: No lymphadenopathy CARDIAC: RRR, no murmurs, rubs, gallops RESPIRATORY:  Clear to auscultation without rales, wheezing or rhonchi  ABDOMEN: Soft, non-tender, non-distended MUSCULOSKELETAL:  No edema; No deformity  SKIN: Warm and dry NEUROLOGIC:  Alert and oriented x 3 PSYCHIATRIC:  Normal affect    Signed, Shirlee More, MD  11/19/2017 11:51 AM    [place]

## 2017-11-19 ENCOUNTER — Ambulatory Visit (INDEPENDENT_AMBULATORY_CARE_PROVIDER_SITE_OTHER): Payer: Medicare Other | Admitting: Cardiology

## 2017-11-19 ENCOUNTER — Encounter: Payer: Self-pay | Admitting: Cardiology

## 2017-11-19 VITALS — BP 126/54 | HR 54 | Ht 62.0 in | Wt 128.6 lb

## 2017-11-19 DIAGNOSIS — I25110 Atherosclerotic heart disease of native coronary artery with unstable angina pectoris: Secondary | ICD-10-CM

## 2017-11-19 DIAGNOSIS — E785 Hyperlipidemia, unspecified: Secondary | ICD-10-CM | POA: Diagnosis not present

## 2017-11-19 NOTE — Patient Instructions (Signed)

## 2017-12-03 DIAGNOSIS — E785 Hyperlipidemia, unspecified: Secondary | ICD-10-CM | POA: Diagnosis not present

## 2017-12-03 DIAGNOSIS — I2510 Atherosclerotic heart disease of native coronary artery without angina pectoris: Secondary | ICD-10-CM | POA: Diagnosis not present

## 2017-12-03 DIAGNOSIS — I10 Essential (primary) hypertension: Secondary | ICD-10-CM | POA: Diagnosis not present

## 2017-12-03 DIAGNOSIS — Z6824 Body mass index (BMI) 24.0-24.9, adult: Secondary | ICD-10-CM | POA: Diagnosis not present

## 2017-12-17 DIAGNOSIS — L259 Unspecified contact dermatitis, unspecified cause: Secondary | ICD-10-CM | POA: Diagnosis not present

## 2017-12-25 DIAGNOSIS — L300 Nummular dermatitis: Secondary | ICD-10-CM | POA: Diagnosis not present

## 2018-03-22 ENCOUNTER — Other Ambulatory Visit: Payer: Self-pay

## 2018-03-26 ENCOUNTER — Other Ambulatory Visit: Payer: Self-pay

## 2018-03-26 DIAGNOSIS — E785 Hyperlipidemia, unspecified: Secondary | ICD-10-CM

## 2018-03-26 MED ORDER — ISOSORBIDE MONONITRATE ER 30 MG PO TB24: 30.0000 mg | ORAL_TABLET | Freq: Every day | ORAL | 0 refills | Status: DC

## 2018-03-26 MED ORDER — EZETIMIBE 10 MG PO TABS: 10.0000 mg | ORAL_TABLET | Freq: Every day | ORAL | 0 refills | Status: DC

## 2018-04-08 DIAGNOSIS — H2513 Age-related nuclear cataract, bilateral: Secondary | ICD-10-CM | POA: Diagnosis not present

## 2018-04-08 DIAGNOSIS — E119 Type 2 diabetes mellitus without complications: Secondary | ICD-10-CM | POA: Diagnosis not present

## 2018-06-09 NOTE — Progress Notes (Signed)
Cardiology Office Note:    Date:  06/10/2018   ID:  Shannon Flowers [place], [place] 08-29-1926, MRN [id number]  PCP:  Charlynn Court, NP  Cardiologist:  Shirlee More, MD    Referring MD: Charlynn Court, NP    ASSESSMENT:    1. Coronary artery disease involving native coronary artery of native heart with unstable angina pectoris ([place])   2. Dyslipidemia    PLAN:    In order of problems listed above:  1. Stable asymptomatic New York Heart Association class I she will continue medical treatment this time I do not think she requires an ischemia evaluation.  She will be given a new prescription for nitroglycerin 2. Statin intolerant continue Zetia check liver function lipid profile   Next appointment: 1 year   Medication Adjustments/Labs and Tests Ordered: Current medicines are reviewed at length with the patient today.  Concerns regarding medicines are outlined above.  No orders of the defined types were placed in this encounter.  No orders of the defined types were placed in this encounter.   Chief Complaint  Patient presents with  . Follow-up  . Coronary Artery Disease    History of Present Illness:     Trenyce Loera is a 85 y.o. female with a hx of coronary artery disease non-ST elevation MI November 2017  hypertension hyperlipidemia and hypotension  last seen 11/19/2017 after [facility] ED visit with chest pain normal troponin and nonischemic EKG.  Decision was made to continue medical treatment and lipid-lowering therapy with isolated Zetia as she is statin intolerant and declined both PCSK9 and vascepa. 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  She has had no anginal discomfort.  Her nitroglycerin is 83 years old  should be given a new prescription Past Medical History:  Diagnosis Date  . ASHD (arteriosclerotic heart disease) 11/16/2016  . Dyslipidemia 11/16/2016  . Non-ST elevation (NSTEMI) myocardial infarction [facility]) 11/16/2016    Past Surgical History:  Procedure Laterality Date  . ABDOMINAL HYSTERECTOMY      Current Medications: Current Meds  Medication Sig  . albuterol (PROAIR HFA) 108 (90 Base) MCG/ACT inhaler Inhale 1 puff into the lungs daily as needed for wheezing.   Marland Kitchen aspirin 81 MG chewable tablet Chew 81 mg by mouth daily.   . carvedilol (COREG) 6.25 MG tablet Take 1 tablet (6.25 mg total) by mouth 2 (two) times daily.  Marland Kitchen ezetimibe (ZETIA) 10 MG tablet Take 1 tablet (10 mg total) by mouth daily.  . isosorbide mononitrate (IMDUR) 30 MG 24 hr tablet Take 1 tablet (30 mg total) by mouth daily.  . nitroGLYCERIN (NITROSTAT) 0.4 MG SL tablet Place 0.4 mg under the tongue every 5 (five) minutes as needed for chest pain.   . Omega-3 Fatty Acids (FISH OIL) 1000 MG CAPS Take 1,200 mg by mouth daily.     Allergies:   Prednisone; Codeine; and Levofloxacin   Social History   Socioeconomic History  . Marital status: Widowed    Spouse name: Not on file  . Number of children: Not on file  . Years of education: Not on file  . Highest education level: Not on file  Occupational History  . Not on file  Social Needs  . Emergency planning/management officer  strain: Not on file  . Food insecurity:    Worry: Not on file    Inability: Not on file  . Transportation needs:    Medical: Not on file    Non-medical: Not on file  Tobacco Use  . Smoking status: Never Smoker  . Smokeless tobacco: Never Used  Substance and Sexual Activity  . Alcohol use: No  . Drug use: No  . Sexual activity: Not on file  Lifestyle  . Physical activity:    Days per week: Not on file    Minutes per session: Not on file  . Stress: Not on file  Relationships  . Social connections:    Talks on phone: Not on file    Gets together:  Not on file    Attends religious service: Not on file    Active member of club or organization: Not on file    Attends meetings of clubs or organizations: Not on file    Relationship status: Not on file  Other Topics Concern  . Not on file  Social History Narrative  . Not on file     Family History: The patient's family history includes Stroke in her father. ROS:   Please see the history of present illness.    All other systems reviewed and are negative.  EKGs/Labs/Other Studies Reviewed:    The following studies were reviewed today:  EKG:  EKG ordered today.  The ekg ordered today demonstrates   Recent Labs: 08/07/2017: BUN 25; Creatinine, Ser 0.88; Hemoglobin 13.8; Platelets 267; Potassium 3.9; Sodium 136  Recent Lipid Panel    Component Value Date/Time   CHOL 169 05/23/2017 1024   TRIG 261 (H) 05/23/2017 1024   HDL 35 (L) 05/23/2017 1024   CHOLHDL 4.8 (H) 05/23/2017 1024   LDLCALC 82 05/23/2017 1024    Physical Exam:    VS:  BP 120/74 (BP Location: Right Arm, Patient Position: Sitting, Cuff Size: Normal)   Pulse 63   Ht 5\' 2"  (1.575 m)   Wt 133 lb 8 oz (60.6 kg)   SpO2 95%   BMI 24.42 kg/m     Wt Readings from Last 3 Encounters:  06/10/18 133 lb 8 oz (60.6 kg)  11/19/17 128 lb 9.6 oz (58.3 kg)  08/07/17 130 lb (59 kg)     GEN:  Well nourished, well developed in no acute distress HEENT: Normal NECK: No JVD; No carotid bruits LYMPHATICS: No lymphadenopathy CARDIAC: RRR, no murmurs, rubs, gallops RESPIRATORY:  Clear to auscultation without rales, wheezing or rhonchi  ABDOMEN: Soft, non-tender, non-distended MUSCULOSKELETAL:  No edema; No deformity  SKIN: Warm and dry NEUROLOGIC:  Alert and oriented x 3 PSYCHIATRIC:  Normal affect    Signed, Shirlee More, MD  06/10/2018 2:52 PM    [facility]

## 2018-06-10 ENCOUNTER — Ambulatory Visit (INDEPENDENT_AMBULATORY_CARE_PROVIDER_SITE_OTHER): Payer: Medicare Other | Admitting: Cardiology

## 2018-06-10 VITALS — BP 120/74 | HR 63 | Ht 62.0 in | Wt 133.5 lb

## 2018-06-10 DIAGNOSIS — E785 Hyperlipidemia, unspecified: Secondary | ICD-10-CM | POA: Diagnosis not present

## 2018-06-10 DIAGNOSIS — I25110 Atherosclerotic heart disease of native coronary artery with unstable angina pectoris: Secondary | ICD-10-CM

## 2018-06-10 MED ORDER — NITROGLYCERIN 0.4 MG SL SUBL: 0.4000 mg | SUBLINGUAL_TABLET | SUBLINGUAL | 11 refills | Status: DC | PRN

## 2018-06-10 NOTE — Addendum Note (Signed)
Addended by: Stevan Born on: 06/10/2018 03:05 PM   Modules accepted: Orders

## 2018-06-10 NOTE — Patient Instructions (Signed)
Medication Instructions:  Your physician recommends that you continue on your current medications as directed. Please refer to the Current Medication list given to you today.  If you need a refill on your cardiac medications before your next appointment, please call your pharmacy.   Lab work: You will have lab work today:  CMP and Lipid If you have labs (blood work) drawn today and your tests are completely normal, you will receive your results only by: Marland Kitchen MyChart Message (if you have MyChart) OR . A paper copy in the mail If you have any lab test that is abnormal or we need to change your treatment, we will call you to review the results.  Testing/Procedures: You will have EKG today  Follow-Up: At [facility], you and your health needs are our priority.  As part of our continuing mission to provide you with exceptional heart care, we have created designated Provider Care Teams.  These Care Teams include your primary Cardiologist (physician) and Advanced Practice Providers (APPs -  Physician Assistants and Nurse Practitioners) who all work together to provide you with the care you need, when you need it. You will need a follow up appointment in 1 years.  Please call our office 2 months in advance to schedule this appointment.

## 2018-06-11 ENCOUNTER — Telehealth: Payer: Self-pay | Admitting: *Deleted

## 2018-06-11 LAB — LIPID PANEL
CHOLESTEROL TOTAL: 189 mg/dL (ref 100–199)
Chol/HDL Ratio: 5.6 ratio — ABNORMAL HIGH (ref 0.0–4.4)
HDL: 34 mg/dL — ABNORMAL LOW (ref >39)
LDL CALC: 91 mg/dL (ref 0–99)
TRIGLYCERIDES: 319 mg/dL — AB (ref 0–149)
VLDL Cholesterol Cal: 64 mg/dL — ABNORMAL HIGH (ref 5–40)

## 2018-06-11 LAB — COMPREHENSIVE METABOLIC PANEL
A/G RATIO: 2 (ref 1.2–2.2)
ALBUMIN: 4.5 g/dL (ref 3.6–4.6)
ALT: 13 IU/L (ref 0–32)
AST: 14 IU/L (ref 0–40)
Alkaline Phosphatase: 82 IU/L (ref 39–117)
BUN / CREAT RATIO: 20 (ref 12–28)
BUN: 18 mg/dL (ref 8–27)
Bilirubin Total: 0.7 mg/dL (ref 0.0–1.2)
CO2: 24 mmol/L (ref 20–29)
CREATININE: 0.88 mg/dL (ref 0.57–1.00)
Calcium: 10.3 mg/dL (ref 8.7–10.3)
Chloride: 101 mmol/L (ref 96–106)
GFR calc Af Amer: 67 mL/min/{1.73_m2} (ref >59)
GFR, EST NON AFRICAN AMERICAN: 58 mL/min/{1.73_m2} — AB (ref >59)
GLOBULIN, TOTAL: 2.2 g/dL (ref 1.5–4.5)
Glucose: 106 mg/dL — ABNORMAL HIGH (ref 65–99)
Potassium: 4.5 mmol/L (ref 3.5–5.2)
Sodium: 139 mmol/L (ref 134–144)
Total Protein: 6.7 g/dL (ref 6.0–8.5)

## 2018-06-11 NOTE — Telephone Encounter (Signed)
-----   Message from Richardo Priest, MD sent at 06/11/2018  6:42 AM EST ----- Stable   No changes in treatment

## 2018-06-11 NOTE — Telephone Encounter (Signed)
Attempted to contact patient to discuss lab results with no answer, unable to leave voicemail. Will continue efforts.

## 2018-07-05 ENCOUNTER — Other Ambulatory Visit: Payer: Self-pay

## 2018-07-05 ENCOUNTER — Telehealth: Payer: Self-pay | Admitting: Cardiology

## 2018-07-05 DIAGNOSIS — E785 Hyperlipidemia, unspecified: Secondary | ICD-10-CM

## 2018-07-05 MED ORDER — CARVEDILOL 6.25 MG PO TABS: 6.2500 mg | ORAL_TABLET | Freq: Two times a day (BID) | ORAL | 3 refills | Status: DC

## 2018-07-05 MED ORDER — ISOSORBIDE MONONITRATE ER 30 MG PO TB24: 30.0000 mg | ORAL_TABLET | Freq: Every day | ORAL | 3 refills | Status: DC

## 2018-07-05 MED ORDER — EZETIMIBE 10 MG PO TABS: 10.0000 mg | ORAL_TABLET | Freq: Every day | ORAL | 3 refills | Status: DC

## 2018-07-05 NOTE — Telephone Encounter (Signed)
Call carvedilol, ezetimibe and isosorbide to [facility]-states she's been without it for 4 days because the pharmacy hasn't gotten a response

## 2018-07-05 NOTE — Addendum Note (Signed)
Addended by: Austin Miles on: 07/05/2018 09:41 AM   Modules accepted: Orders

## 2018-07-05 NOTE — Telephone Encounter (Signed)
Refills for carvedilol, zetia, and imdur sent to [facility] as requested.   Attempted to contact patient with no success to give her normal lab results from 06/10/2018. Unable to leave message, busy signal on multiple attempts.

## 2018-08-07 DIAGNOSIS — L03115 Cellulitis of right lower limb: Secondary | ICD-10-CM | POA: Diagnosis not present

## 2018-12-18 IMAGING — DX DG CHEST 2V
2 series · 2 of 2 positions shown · non-contrast
Comparison: 12/09/2016 chest radiograph.

CLINICAL DATA: Chest pain

EXAM:
CHEST - 2 VIEW

[chest pa]
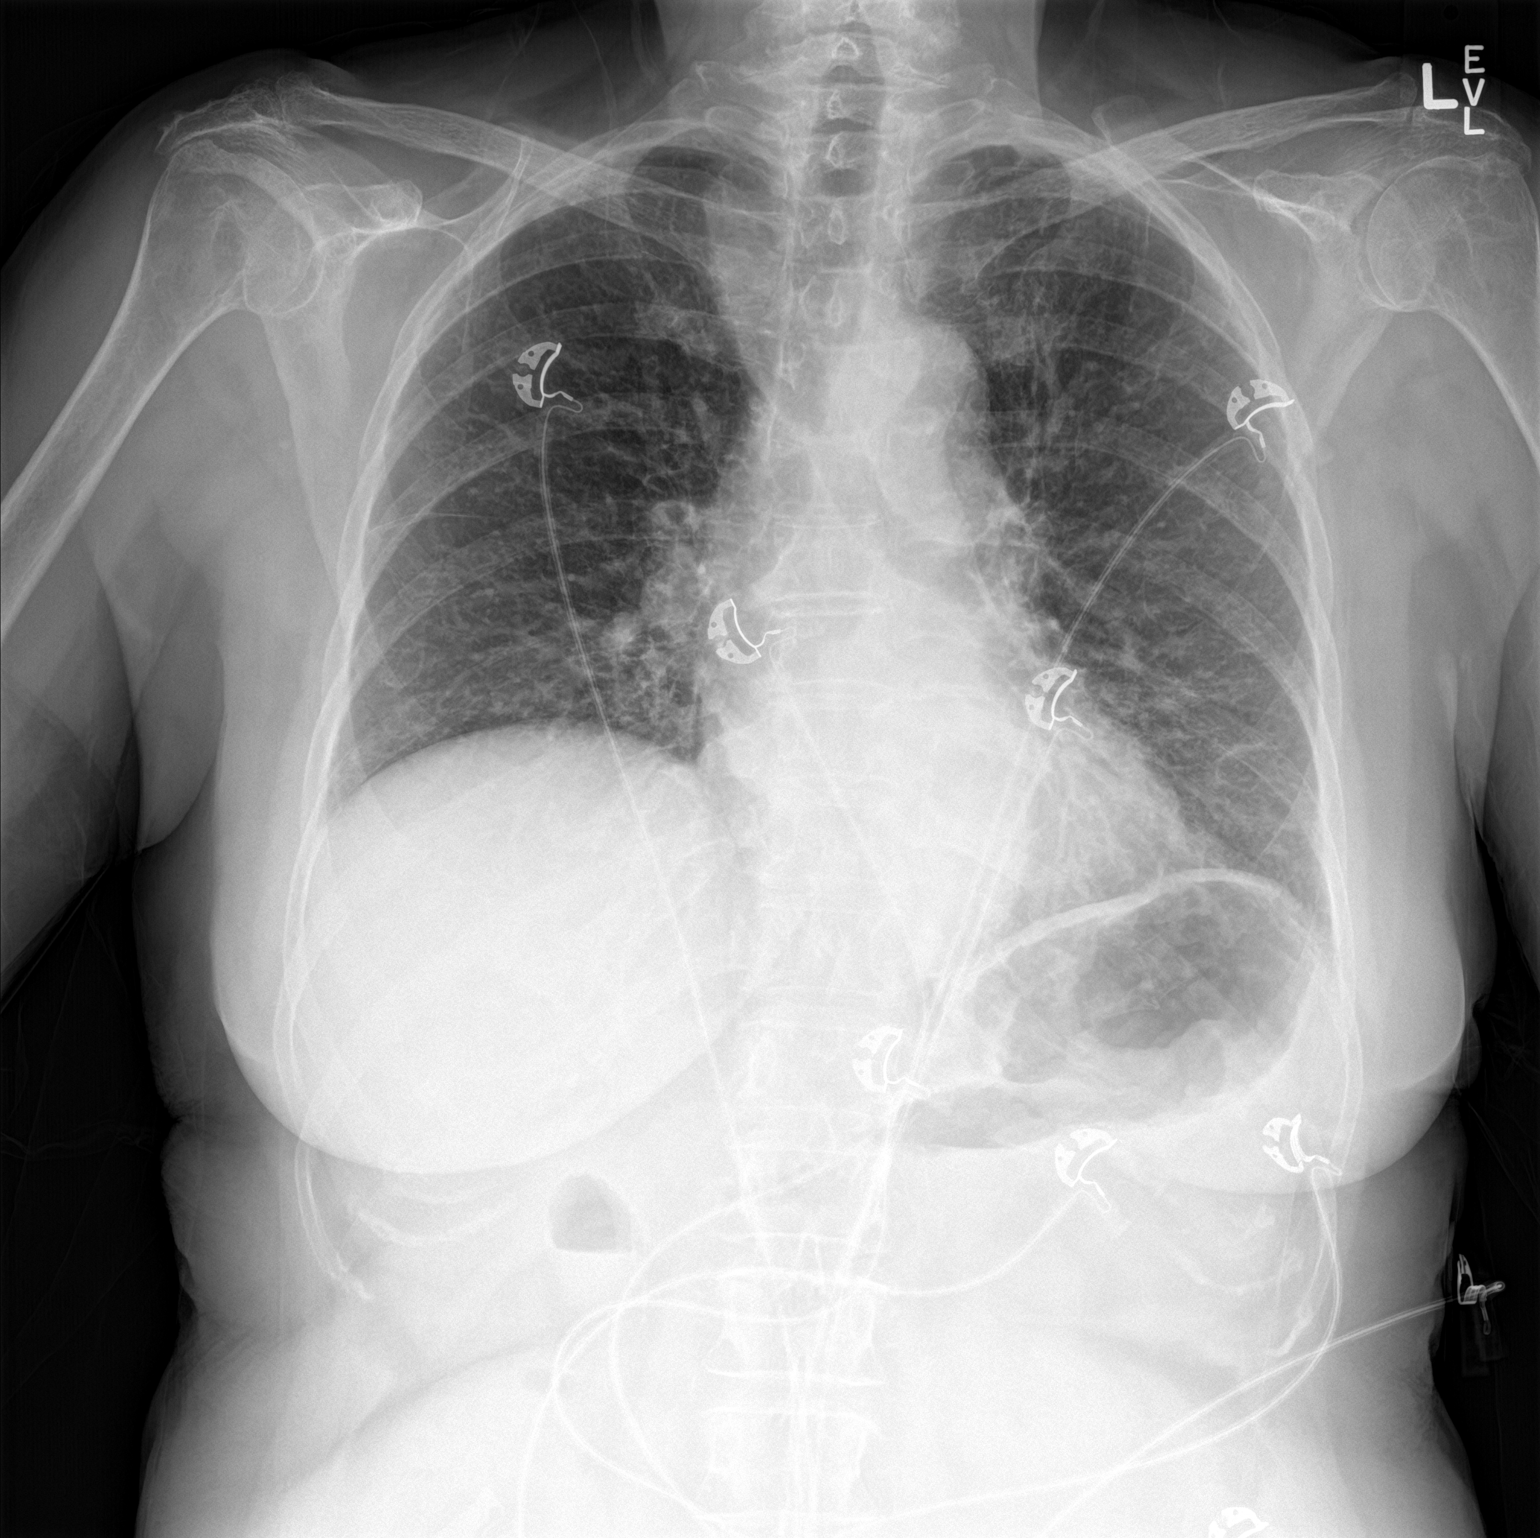

[chest lat]
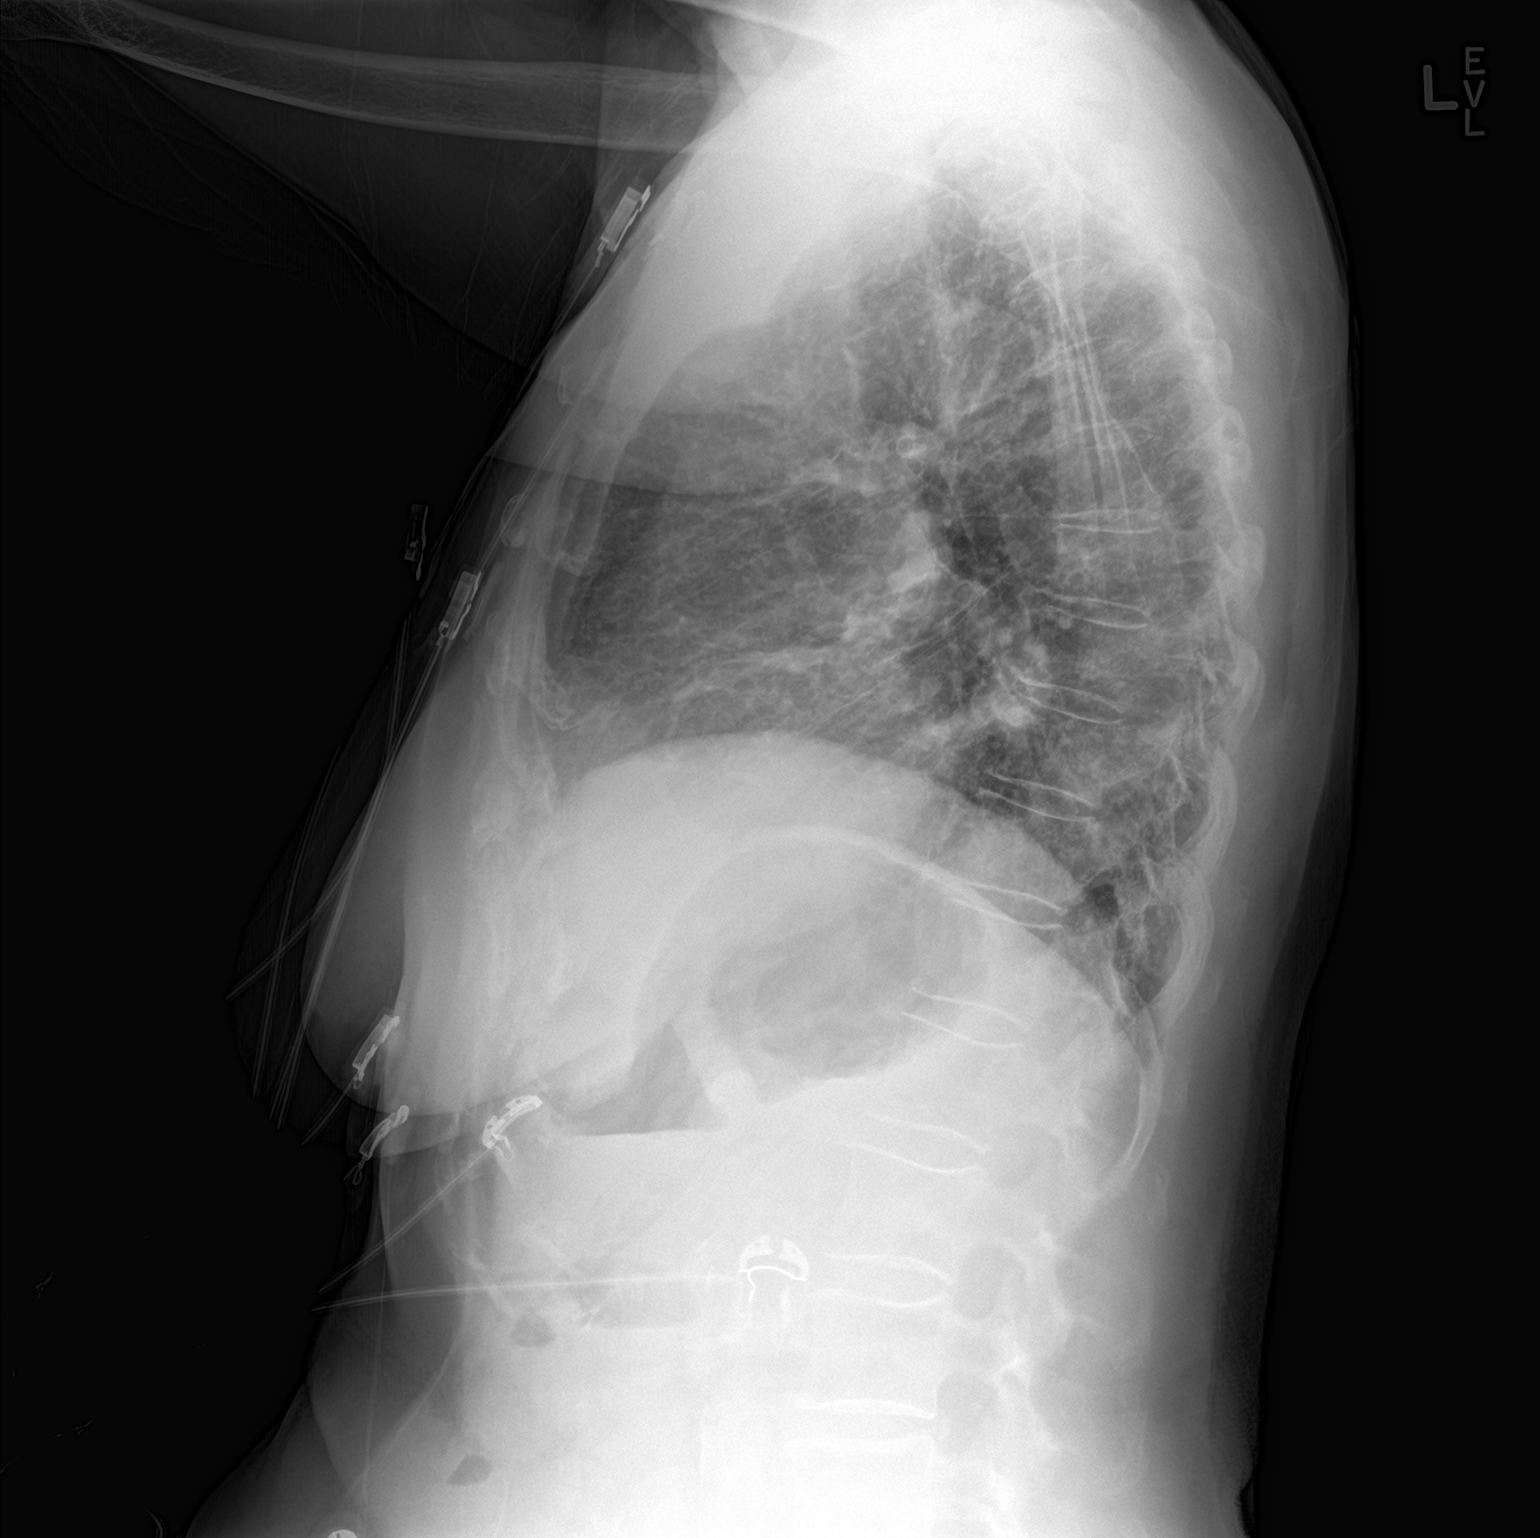

[2 of 2 positions shown; findings below may reference images not displayed]

FINDINGS: Stable cardiomediastinal silhouette with normal heart size. No
pneumothorax. No pleural effusion. Minimal left basilar scarring
versus atelectasis. No pulmonary edema. No acute consolidative
airspace disease.
IMPRESSION: Minimal left basilar scarring versus atelectasis.

## 2019-07-07 ENCOUNTER — Other Ambulatory Visit: Payer: Self-pay | Admitting: *Deleted

## 2019-07-07 ENCOUNTER — Telehealth: Payer: Self-pay | Admitting: Cardiology

## 2019-07-07 ENCOUNTER — Other Ambulatory Visit: Payer: Self-pay | Admitting: Cardiology

## 2019-07-07 MED ORDER — ISOSORBIDE MONONITRATE ER 30 MG PO TB24: 30.0000 mg | ORAL_TABLET | Freq: Every day | ORAL | 1 refills | Status: DC

## 2019-07-07 NOTE — Telephone Encounter (Signed)
New message      *STAT* If patient is at the pharmacy, call can be transferred to refill team.   1. Which medications need to be refilled? (please list name of each medication and dose if known) isosorbide mononitrate (IMDUR) 30 MG 24 hr tablet(Expired)  2. Which pharmacy/location (including street and city if local pharmacy) is medication to be sent to? [facility] [place]  3. Do they need a 30 day or 90 day supply? [place]

## 2019-07-07 NOTE — Telephone Encounter (Signed)
Refill sent.

## 2019-07-30 NOTE — Progress Notes (Signed)
Cardiology Office Note:    Date:  07/31/2019   ID:  [name] [place], [place] 20-Mar-1927, MRN [id number]  PCP:  Charlynn Court, NP  Cardiologist:  Shirlee More, MD    Referring MD: Charlynn Court, NP    ASSESSMENT:    1. Coronary artery disease involving native coronary artery of native heart with unstable angina pectoris ([place])   2. Dyslipidemia   3. Statin intolerance    PLAN:    In order of problems listed above:  1. Stable with her personal wishes age and comorbidities I will continue her current medical treatment she is asymptomatic New York Heart Association class I and has no evidence of recurrent ischemia or heart failure.  Her medical treatment will continue including aspirin beta-blocker oral nitrate and lipid-lowering on statin 2. Continue nonstatin Zetia and fish oil.  Recent labs requested 3. COVID-19 education, unfortunately I cannot convince her to consider vaccine but she continues to practice 3 diabetes.   Next appointment: 1 year or sooner I offered a new prescription for nitroglycerin she is declined she says that her dose has not been opened.   Medication Adjustments/Labs and Tests Ordered: Current medicines are reviewed at length with the patient today.  Concerns regarding medicines are outlined above.  No orders of the defined types were placed in this encounter.  No orders of the defined types were placed in this encounter.   Chief Complaint  Patient presents with  . Follow-up  . Coronary Artery Disease  . Hyperlipidemia    History of Present Illness:    Shannon Flowers is a 86 y.o. female with a hx of coronary artery disease non-ST elevation MI November 2017  hypertension hyperlipidemia and hypotension  last seen 11/19/2017 after [facility] ED visit with chest pain normal troponin and nonischemic EKG.  Decision was made to continue medical treatment and lipid-lowering therapy with isolated Zetia as she is statin  intolerant and declined both PCSK9 and vascepa.   She was last seen 06/10/2018 and was New York Heart Association class I for CAD.Marland Kitchen Compliance with diet, lifestyle and medications: Yes  She continues to do remarkably well she is ready to start her garden and has no exercise intolerance shortness of breath chest pain palpitation or syncope.  She tolerates Zetia and was statin intolerant and declined PCSK9 therapy.  She tells me that she had labs done with her PCP and I will request a copy today.  Her last lipid profile was accessible to me at 06/10/2018 cholesterol 189 LDL 91 at target HDL 34 creatinine normal.  She has had no intercurrent illness no exposure to COVID-19 and I do not think she is going to accept vaccine despite my best attempt to convince her. Past Medical History:  Diagnosis Date  . ASHD (arteriosclerotic heart disease) 11/16/2016  . Dyslipidemia 11/16/2016  . Non-ST elevation (NSTEMI) myocardial infarction [facility]) 11/16/2016    Past Surgical History:  Procedure Laterality Date  . ABDOMINAL HYSTERECTOMY      Current Medications: Current Meds  Medication Sig  . albuterol (PROAIR HFA) 108 (90 Base) MCG/ACT inhaler Inhale 1 puff into the lungs daily as needed for wheezing.   Marland Kitchen aspirin 81 MG chewable tablet Chew 81 mg by mouth daily.   . carvedilol (COREG) 6.25 MG tablet TAKE ONE TABLET BY MOUTH TWICE DAILY  . ezetimibe (ZETIA) 10 MG tablet Take 1 tablet (10 mg total) by mouth daily.  . isosorbide mononitrate (IMDUR) 30 MG 24  hr tablet TAKE ONE TABLET BY MOUTH EVERY DAY  . nitroGLYCERIN (NITROSTAT) 0.4 MG SL tablet Place 1 tablet (0.4 mg total) under the tongue every 5 (five) minutes as needed for chest pain.  . Omega-3 Fatty Acids (FISH OIL) 1000 MG CAPS Take 1,200 mg by mouth daily.     Allergies:   Prednisone, Codeine, and Levofloxacin   Social History   Socioeconomic History  . Marital status: Widowed    Spouse name: Not on file  . Number of children: Not on file  .  Years of education: Not on file  . Highest education level: Not on file  Occupational History  . Not on file  Tobacco Use  . Smoking status: Never Smoker  . Smokeless tobacco: Never Used  Substance and Sexual Activity  . Alcohol use: No  . Drug use: No  . Sexual activity: Not on file  Other Topics Concern  . Not on file  Social History Narrative  . Not on file   Social Determinants of Health   Financial Resource Strain:   . Difficulty of Paying Living Expenses:   Food Insecurity:   . Worried About Charity fundraiser in the Last Year:   . Arboriculturist in the Last Year:   Transportation Needs:   . Film/video editor (Medical):   Marland Kitchen Lack of Transportation (Non-Medical):   Physical Activity:   . Days of Exercise per Week:   . Minutes of Exercise per Session:   Stress:   . Feeling of Stress :   Social Connections:   . Frequency of Communication with Friends and Family:   . Frequency of Social Gatherings with Friends and Family:   . Attends Religious Services:   . Active Member of Clubs or Organizations:   . Attends Archivist Meetings:   Marland Kitchen Marital Status:      Family History: The patient's family history includes Stroke in her father. ROS:   Please see the history of present illness.    All other systems reviewed and are negative.  EKGs/Labs/Other Studies Reviewed:    The following studies were reviewed today:  EKG:  EKG ordered today and personally reviewed.  The ekg ordered today demonstrates shows sinus rhythm and a pattern of apical myocardial infarction 3 aVF V3 V4 QS from EKG old  Recent Labs: No results found for requested labs within last 8760 hours.  Recent Lipid Panel    Component Value Date/Time   CHOL 189 06/10/2018 1513   TRIG 319 (H) 06/10/2018 1513   HDL 34 (L) 06/10/2018 1513   CHOLHDL 5.6 (H) 06/10/2018 1513   LDLCALC 91 06/10/2018 1513    Physical Exam:    VS:  BP 124/70   Pulse 62   Temp 97.7 F (36.5 C)   Ht 5\' 2"   (1.575 m)   Wt 135 lb 3.2 oz (61.3 kg)   SpO2 95%   BMI 24.73 kg/m     Wt Readings from Last 3 Encounters:  07/31/19 135 lb 3.2 oz (61.3 kg)  06/10/18 133 lb 8 oz (60.6 kg)  11/19/17 128 lb 9.6 oz (58.3 kg)     GEN: She looks younger than her age well nourished, well developed in no acute distress HEENT: Normal NECK: No JVD; No carotid bruits LYMPHATICS: No lymphadenopathy CARDIAC: RRR, no murmurs, rubs, gallops RESPIRATORY:  Clear to auscultation without rales, wheezing or rhonchi  ABDOMEN: Soft, non-tender, non-distended MUSCULOSKELETAL:  No edema; No deformity  SKIN: Warm  and dry NEUROLOGIC:  Alert and oriented x 3 PSYCHIATRIC:  Normal affect    Signed, Shirlee More, MD  07/31/2019 9:58 AM    [place]

## 2019-07-31 ENCOUNTER — Other Ambulatory Visit: Payer: Self-pay

## 2019-07-31 ENCOUNTER — Ambulatory Visit (INDEPENDENT_AMBULATORY_CARE_PROVIDER_SITE_OTHER): Payer: Medicare Other | Admitting: Cardiology

## 2019-07-31 ENCOUNTER — Encounter: Payer: Self-pay | Admitting: Cardiology

## 2019-07-31 ENCOUNTER — Telehealth: Payer: Self-pay

## 2019-07-31 VITALS — BP 124/70 | HR 62 | Temp 97.7°F | Ht 62.0 in | Wt 135.2 lb

## 2019-07-31 DIAGNOSIS — Z789 Other specified health status: Secondary | ICD-10-CM

## 2019-07-31 DIAGNOSIS — I9589 Other hypotension: Secondary | ICD-10-CM | POA: Diagnosis not present

## 2019-07-31 DIAGNOSIS — E785 Hyperlipidemia, unspecified: Secondary | ICD-10-CM | POA: Diagnosis not present

## 2019-07-31 DIAGNOSIS — I252 Old myocardial infarction: Secondary | ICD-10-CM

## 2019-07-31 DIAGNOSIS — I25110 Atherosclerotic heart disease of native coronary artery with unstable angina pectoris: Secondary | ICD-10-CM | POA: Diagnosis not present

## 2019-07-31 NOTE — Telephone Encounter (Signed)
Left message for patient to please call back in regards to coming into the office to get some lab work done.

## 2019-07-31 NOTE — Telephone Encounter (Signed)
Dr Bettina Gavia notified that patient will not come back to office for lab work.

## 2019-07-31 NOTE — Telephone Encounter (Signed)
Spoke to patient regarding her need to come into the office to get labs. She states that she will have her PCP do it but is not going to come to our office to have them done.

## 2019-07-31 NOTE — Addendum Note (Signed)
Addended by: Resa Miner I on: 07/31/2019 11:06 AM   Modules accepted: Orders

## 2019-07-31 NOTE — Patient Instructions (Signed)

## 2019-10-09 ENCOUNTER — Other Ambulatory Visit: Payer: Self-pay | Admitting: Cardiology

## 2020-10-11 ENCOUNTER — Other Ambulatory Visit: Payer: Self-pay | Admitting: Cardiology

## 2020-10-11 NOTE — Telephone Encounter (Signed)
Rx approved and sent w/ instructions to arrange an appt for further refills.

## 2021-02-20 DIAGNOSIS — I10 Essential (primary) hypertension: Secondary | ICD-10-CM | POA: Diagnosis not present

## 2021-02-20 DIAGNOSIS — E785 Hyperlipidemia, unspecified: Secondary | ICD-10-CM | POA: Diagnosis not present

## 2021-02-20 DIAGNOSIS — R9431 Abnormal electrocardiogram [ECG] [EKG]: Secondary | ICD-10-CM | POA: Diagnosis not present

## 2021-02-20 DIAGNOSIS — I214 Non-ST elevation (NSTEMI) myocardial infarction: Secondary | ICD-10-CM | POA: Diagnosis not present

## 2021-07-24 DIAGNOSIS — R9431 Abnormal electrocardiogram [ECG] [EKG]: Secondary | ICD-10-CM | POA: Insufficient documentation

## 2021-07-24 NOTE — Progress Notes (Signed)
?  ?Cardiology Office Note ? ? ?Date:  07/25/2021  ? ?ID:  Shannon Flowers, [place] Jun 26, 1926, MRN [id number] ? ?PCP:  Madison Hickman, FNP  ?Cardiologist:   None ?Referring:  Madison Hickman, FNP ? ?No chief complaint on file. ? ? ?  ?History of Present Illness: ?Shannon Flowers is a 88 y.o. female who presents for evaluation of CAD.   She had non-ST elevation MI November 2017.  She hypertension and hyperlipidemia.  Looking back through the chart I can find some description of her cardiac cath.  She had LAD calcification with 95% diffuse disease and a small vessel.  There were luminal irregularities in the circumflex.  The right coronary artery had focal eccentric 80 to 90% stenosis.  She was not thought to have a good lesion for PCI and she was turned down by surgery for having more diffuse disease. ? ?She has seen Dr. Bettina Gavia but has not seen him since 2021.  This is a new patient appt as the onset of new symptoms prompts this visit.  Looking in her primary care notes she was hospitalized from February 19, 2021  to February 20, 2021 for chest pain.   This was at [facility].  She was apparently managed medically.  She lives alone.  She does all of her household chores.  She gets around up and down stairs in the basement.  She gets some chest discomfort.  She feels a tightness in her chest.  She knows that her blood pressure is elevated when it happens.  She will take it and sometimes it is in the 180s.  She said in the hospital it was in the 200s but she was agitated.  She says she waits 15 minutes and it all goes away.  She describes her typical blood pressure is 115/65 although again she is describing some spikes.  She says her chest discomfort can happen at rest.  She has not taken any nitroglycerin.  She says she had her blood pressure medicines reduced in the hospital in [place] because of apparently low heart rates and blood pressures.  She has not had any presyncope or syncope.  She denies  any new shortness of breath, PND or orthopnea. ? ? ?Past Medical History:  ?Diagnosis Date  ? ASHD (arteriosclerotic heart disease) 11/16/2016  ? CAD (coronary artery disease)   ? Dyslipidemia 11/16/2016  ? Non-ST elevation (NSTEMI) myocardial infarction ([place]) 11/16/2016  ? ? ?Past Surgical History:  ?Procedure Laterality Date  ? ABDOMINAL HYSTERECTOMY    ? ?Family History  ?Problem Relation Age of Onset  ? Stroke Father   ? ?Social History  ? ?Socioeconomic History  ? Marital status: Widowed  ?  Spouse name: Not on file  ? Number of children: Not on file  ? Years of education: Not on file  ? Highest education level: Not on file  ?Occupational History  ? Not on file  ?Tobacco Use  ? Smoking status: Never  ? Smokeless tobacco: Never  ?Vaping Use  ? Vaping Use: Never used  ?Substance and Sexual Activity  ? Alcohol use: No  ? Drug use: No  ? Sexual activity: Not on file  ?Other Topics Concern  ? Not on file  ?Social History Narrative  ? Not on file  ? ?Social Determinants of Health  ? ?Financial Resource Strain: Not on file  ?Food Insecurity: Not on file  ?Transportation Needs: Not on file  ?Physical Activity: Not on file  ?Stress: Not  on file  ?Social Connections: Not on file  ?Intimate Partner Violence: Not on file  ? ? ? ?Current Outpatient Medications  ?Medication Sig Dispense Refill  ? aspirin 81 MG chewable tablet Chew 81 mg by mouth daily.     ? carvedilol (COREG) 3.125 MG tablet Take 3.125 mg by mouth 2 (two) times daily with a meal.    ? isosorbide mononitrate (IMDUR) 30 MG 24 hr tablet TAKE ONE TABLET BY MOUTH EVERY DAY 30 tablet 0  ? ?No current facility-administered medications for this visit.  ? ? ?Allergies:   Prednisone, Codeine, and Levofloxacin  ? ?ROS:  Please see the history of present illness.   Otherwise, review of systems are positive for none.   All other systems are reviewed and negative.  ? ? ?PHYSICAL EXAM: ?VS:  BP (!) 156/76   Pulse 63   Ht '5\' 2"'$  (1.575 m)   Wt 118 lb 9.6 oz (53.8 kg)    SpO2 97%   BMI 21.69 kg/m?  , BMI Body mass index is 21.69 kg/m?. ?GENERAL:  Well appearing ?NECK:  No jugular venous distention, waveform within normal limits, carotid upstroke brisk and symmetric, no bruits, no thyromegaly ?LUNGS:  Clear to auscultation bilaterally ?CHEST:  Unremarkable ?HEART:  PMI not displaced or sustained,S1 and S2 within normal limits, no S3, no S4, no clicks, no rubs, no murmurs ?ABD:  Flat, positive bowel sounds normal in frequency in pitch, no bruits, no rebound, no guarding, no midline pulsatile mass, no hepatomegaly, no splenomegaly ?EXT:  2 plus pulses throughout, no edema, no cyanosis no clubbing ? ? ?EKG:  EKG is ordered today. ?The ekg ordered today demonstrates sinus rhythm, rate 63, left axis deviation, probable old anteroseptal infarct, first-degree AV block, lateral T wave changes unchanged from previous EKGs. ? ? ?Recent Labs: ?No results found for requested labs within last 8760 hours.  ? ? ?Lipid Panel ?   ?Component Value Date/Time  ? CHOL 189 06/10/2018 1513  ? TRIG 319 (H) 06/10/2018 1513  ? HDL 34 (L) 06/10/2018 1513  ? CHOLHDL 5.6 (H) 06/10/2018 1513  ? [place] 91 06/10/2018 1513  ? ?  ? ?Wt Readings from Last 3 Encounters:  ?07/25/21 118 lb 9.6 oz (53.8 kg)  ?07/31/19 135 lb 3.2 oz (61.3 kg)  ?06/10/18 133 lb 8 oz (60.6 kg)  ?  ? ? ?Other studies Reviewed: ?Additional studies/ records that were reviewed today include: Previous catheterization, hospital records, primary care records. ?Review of the above records demonstrates:  Please see elsewhere in the note.   ? ? ?ASSESSMENT AND PLAN: ? ?CAD: I had a long discussion with her son and I will also try to call her granddaughter who is a Designer, jewellery.  They would want conservative management and I think this is reasonable as I think repeat catheterization is like really not to be useful to identify culprit lesion.  For now I will increase her Imdur to 60 mg daily.  She will continue the aspirin and a very low-dose  beta-blocker. ? ?Dyslipidemia:   She has not wanted to take PCSK9 inhibitor or statin.   ? ?HTN: Her blood pressure is labile.  I am going to give her as needed hydralazine only to take if her blood pressure sustains greater than 180.  I will discuss this with her granddaughter. ? ? ? ?Current medicines are reviewed at length with the patient today.  The patient does not have concerns regarding medicines. ? ?The following changes have been  made:  no change ? ?Labs/ tests ordered today include:  ?No orders of the defined types were placed in this encounter. ? ? ? ?Disposition:   FU with me in 2 months.  Patient and ? ? ?Signed, ?Minus Breeding, MD  ?07/25/2021 9:43 AM    ?[place] ? ? ? ?

## 2021-07-25 ENCOUNTER — Ambulatory Visit (INDEPENDENT_AMBULATORY_CARE_PROVIDER_SITE_OTHER): Payer: Medicare Other | Admitting: Cardiology

## 2021-07-25 ENCOUNTER — Encounter: Payer: Self-pay | Admitting: Cardiology

## 2021-07-25 ENCOUNTER — Other Ambulatory Visit: Payer: Self-pay

## 2021-07-25 VITALS — BP 156/76 | HR 63 | Ht 62.0 in | Wt 118.6 lb

## 2021-07-25 DIAGNOSIS — E785 Hyperlipidemia, unspecified: Secondary | ICD-10-CM | POA: Diagnosis not present

## 2021-07-25 DIAGNOSIS — R9431 Abnormal electrocardiogram [ECG] [EKG]: Secondary | ICD-10-CM | POA: Diagnosis not present

## 2021-07-25 MED ORDER — HYDRALAZINE HCL 10 MG PO TABS: ORAL_TABLET | ORAL | 6 refills | Status: DC

## 2021-07-25 MED ORDER — ISOSORBIDE MONONITRATE ER 60 MG PO TB24: 60.0000 mg | ORAL_TABLET | Freq: Every day | ORAL | 3 refills | Status: DC

## 2021-07-25 MED ORDER — CARVEDILOL 3.125 MG PO TABS: 3.1250 mg | ORAL_TABLET | Freq: Two times a day (BID) | ORAL | 3 refills | Status: DC

## 2021-07-25 NOTE — Patient Instructions (Signed)
Medication Instructions:  ?Increase Imdur 60 mg daily ?Start taking Hydralazine 10 mg every 12 hours if needed if Systolic Blood Pressure greater than 180 ?Continue all other medications ?*If you need a refill on your cardiac medications before your next appointment, please call your pharmacy* ? ? ?Lab Work: ?None ordered ? ? ?Testing/Procedures: ?None ordered ? ? ?Follow-Up: ?At [facility], you and your health needs are our priority.  As part of our continuing mission to provide you with exceptional heart care, we have created designated Provider Care Teams.  These Care Teams include your primary Cardiologist (physician) and Advanced Practice Providers (APPs -  Physician Assistants and Nurse Practitioners) who all work together to provide you with the care you need, when you need it. ? ?We recommend signing up for the patient portal called "MyChart".  Sign up information is provided on this After Visit Summary.  MyChart is used to connect with patients for Virtual Visits (Telemedicine).  Patients are able to view lab/test results, encounter notes, upcoming appointments, etc.  Non-urgent messages can be sent to your provider as well.   ?To learn more about what you can do with MyChart, go to [email or web address].   ? ?Your next appointment:  2 months ?  ? ?The format for your next appointment: Office  ? ? ?Provider:  Dr.Hochrein ? ? ?

## 2021-09-25 DIAGNOSIS — I10 Essential (primary) hypertension: Secondary | ICD-10-CM | POA: Insufficient documentation

## 2021-09-25 NOTE — Progress Notes (Signed)
Cardiology Office Note   Date:  09/26/2021   ID:  [name] [facility], [place] [birth year], MRN [id number]  PCP:  Madison Hickman, FNP  Cardiologist:   None Referring:  Madison Hickman, FNP  Chief Complaint  Patient presents with   Chest Pain      History of Present Illness: Shannon Flowers is a 88 y.o. female who presents for evaluation of CAD.   She had non-ST elevation MI November 2017.  She hypertension and hyperlipidemia.  Looking back through the chart I can find some description of her cardiac cath.  She had LAD calcification with 95% diffuse disease and a small vessel.  There were luminal irregularities in the circumflex.  The right coronary artery had focal eccentric 80 to 90% stenosis.  She was not thought to have a good lesion for PCI and she was turned down by surgery for having more diffuse disease.  I saw her recently with chest pain.  This was my first visit with her and we opted to manage her conservatively.  I started Imdur and we started PRN hydralazine for SBP greater than 180.      She has used her hydralazine about 4 times since I saw her.  She might get a little chest discomfort when she takes this because she knows her blood pressure is going up and it seems to work very well.   The patient denies any new symptoms such as chest discomfort, neck or arm discomfort. There has been no new shortness of breath, PND or orthopnea. There have been no reported palpitations, presyncope or syncope.    Past Medical History:  Diagnosis Date   ASHD (arteriosclerotic heart disease) 11/16/2016   CAD (coronary artery disease)    Dyslipidemia 11/16/2016   Non-ST elevation (NSTEMI) myocardial infarction [facility]) 11/16/2016    Past Surgical History:  Procedure Laterality Date   ABDOMINAL HYSTERECTOMY     BLADDER SURGERY     Current Outpatient Medications  Medication Sig Dispense Refill   aspirin 81 MG chewable tablet Chew 81 mg by mouth daily.      carvedilol  (COREG) 3.125 MG tablet Take 1 tablet (3.125 mg total) by mouth 2 (two) times daily with a meal. 180 tablet 3   hydrALAZINE (APRESOLINE) 10 MG tablet Take 10 mg every 12 hours if needed for Systolic Blood Pressure greater than 180 60 tablet 6   isosorbide mononitrate (IMDUR) 60 MG 24 hr tablet Take 1 tablet (60 mg total) by mouth daily. 90 tablet 3   No current facility-administered medications for this visit.    Allergies:   Prednisone, Codeine, and Levofloxacin   ROS:  Please see the history of present illness.   Otherwise, review of systems are positive for none.   All other systems are reviewed and negative.    PHYSICAL EXAM: VS:  BP 140/62   Pulse 68   Ht '5\' 2"'$  (1.575 m)   Wt 120 lb (54.4 kg)   SpO2 93%   BMI 21.95 kg/m  , BMI Body mass index is 21.95 kg/m. GENERAL:  Well appearing NECK:  No jugular venous distention, waveform within normal limits, carotid upstroke brisk and symmetric, no bruits, no thyromegaly LUNGS:  Clear to auscultation bilaterally CHEST:  Well healed sternotomy scar. HEART:  PMI not displaced or sustained,S1 and S2 within normal limits, no S3, no S4, no clicks, no rubs, no murmurs ABD:  Flat, positive bowel sounds normal in frequency in pitch, no bruits,  no rebound, no guarding, no midline pulsatile mass, no hepatomegaly, no splenomegaly EXT:  2 plus pulses throughout, no edema, no cyanosis no clubbing   EKG:  EKG is not ordered today.  We will Recent Labs: No results found for requested labs within last 8760 hours.    Lipid Panel    Component Value Date/Time   CHOL 189 06/10/2018 1513   TRIG 319 (H) 06/10/2018 1513   HDL 34 (L) 06/10/2018 1513   CHOLHDL 5.6 (H) 06/10/2018 1513   LDLCALC 91 06/10/2018 1513      Wt Readings from Last 3 Encounters:  09/26/21 120 lb (54.4 kg)  07/25/21 118 lb 9.6 oz (53.8 kg)  07/31/19 135 lb 3.2 oz (61.3 kg)      Other studies Reviewed: Additional studies/ records that were reviewed today include:  None Review of the above records demonstrates:  Please see elsewhere in the note.     ASSESSMENT AND PLAN:  CAD:     The patient has no new sypmtoms.  No further cardiovascular testing is indicated.  We will continue with aggressive risk reduction and meds as listed  Dyslipidemia:   LDL was 91.  HDL 34.  She has not tolerated statins and does not want PCSK9.   HTN:    The blood pressure is labile but taking as needed hydralazine seems to be a good solution.  No change in therapy.    Current medicines are reviewed at length with the patient today.  The patient does not have concerns regarding medicines.  The following changes have been made:  no change  Labs/ tests ordered today include:  No orders of the defined types were placed in this encounter.    Disposition:   FU with me in 6 months.    Signed, Minus Breeding, MD  09/26/2021 9:48 AM    [facility]

## 2021-09-26 ENCOUNTER — Ambulatory Visit (INDEPENDENT_AMBULATORY_CARE_PROVIDER_SITE_OTHER): Payer: Medicare Other | Admitting: Cardiology

## 2021-09-26 ENCOUNTER — Encounter: Payer: Self-pay | Admitting: Cardiology

## 2021-09-26 VITALS — BP 140/62 | HR 68 | Ht 62.0 in | Wt 120.0 lb

## 2021-09-26 DIAGNOSIS — I10 Essential (primary) hypertension: Secondary | ICD-10-CM | POA: Diagnosis not present

## 2021-09-26 DIAGNOSIS — I25110 Atherosclerotic heart disease of native coronary artery with unstable angina pectoris: Secondary | ICD-10-CM

## 2021-09-26 NOTE — Patient Instructions (Signed)
Medication Instructions:  Your physician recommends that you continue on your current medications as directed. Please refer to the Current Medication list given to you today.  *If you need a refill on your cardiac medications before your next appointment, please call your pharmacy*  Follow-Up: At [facility], you and your health needs are our priority.  As part of our continuing mission to provide you with exceptional heart care, we have created designated Provider Care Teams.  These Care Teams include your primary Cardiologist (physician) and Advanced Practice Providers (APPs -  Physician Assistants and Nurse Practitioners) who all work together to provide you with the care you need, when you need it.  We recommend signing up for the patient portal called "MyChart".  Sign up information is provided on this After Visit Summary.  MyChart is used to connect with patients for Virtual Visits (Telemedicine).  Patients are able to view lab/test results, encounter notes, upcoming appointments, etc.  Non-urgent messages can be sent to your provider as well.   To learn more about what you can do with MyChart, go to [email or web address].    Your next appointment:   6 month(s)  The format for your next appointment:   In Person  Provider:   Dr. Percival Spanish  Important Information About [place]

## 2022-02-20 ENCOUNTER — Telehealth: Payer: Self-pay | Admitting: Cardiology

## 2022-02-20 NOTE — Telephone Encounter (Signed)
Spoke to patient's son Alvester Chou.He stated mother's B/P has been elevated.She has been taking Hydralazine more frequently as prescribed.She wanted appointment to see Dr.Hochrein.She only wants to see Dr.Hochrein.Appointment scheduled with Dr.Hochrein 10/27 at 1:30 pm.Advised to bring B/P readings and all medications to appointment.She will call sooner if needed.

## 2022-02-20 NOTE — Telephone Encounter (Signed)
Pt c/o BP issue: STAT if pt c/o blurred vision, one-sided weakness or slurred speech  1. What are your last 5 BP readings?  179/91 - today  190/? 2. Are you having any other symptoms (ex. Dizziness, headache, blurred vision, passed out)? A little dizziness   3. What is your BP issue? Pt son calling with pt, states pt has been taking her hydralazine everyday now because her bp has been high. Please advise

## 2022-03-02 NOTE — Progress Notes (Signed)
Cardiology Office Note   Date:  03/03/2022   ID:  Shannon Flowers Shannon Flowers, [place] [birth year], MRN [id number]  PCP:  Madison Hickman, FNP  Cardiologist:   Minus Breeding, MD Referring:  Madison Hickman, FNP  Chief Complaint  Patient presents with   Neck Pain      History of Present Illness: Shannon Flowers is a 88 y.o. female who presents for evaluation of CAD.   She had non-ST elevation MI November 2017.  She hypertension and hyperlipidemia.  Looking back through the chart I can find some description of her cardiac cath.  She had LAD calcification with 95% diffuse disease and a small vessel.  There were luminal irregularities in the circumflex.  The right coronary artery had focal eccentric 80 to 90% stenosis.  She was not thought to have a good lesion for PCI and she was turned down by surgery for having more diffuse disease.  I saw her recently with chest pain.  We opted to manage her conservatively.  I started Imdur and we started PRN hydralazine for SBP greater than 180.      Since I last saw her she started to get some throat pain.  This is for 2 to 3 weeks.  This happens sporadically.  It can happen when she is relatively sedentary.  She says she is got a vacuum but leaves using a riding lawnmower.  She does all the chores around her house.  This will necessarily bring on any discomfort but it might.  She is not having any new shortness of breath, PND or orthopnea.  She is not having any new palpitations, presyncope or syncope.  She is likely been a little confused as to when to take her hydralazine.  She is actually started taking it for the neck pain.  I do not see any systolic blood pressures on her little diary they are over 180 however.  Typically they are controlled.   Past Medical History:  Diagnosis Date   ASHD (arteriosclerotic heart disease) 11/16/2016   CAD (coronary artery disease)    Dyslipidemia 11/16/2016   Non-ST elevation (NSTEMI) myocardial  infarction ([place]) 11/16/2016    Past Surgical History:  Procedure Laterality Date   ABDOMINAL HYSTERECTOMY     BLADDER SURGERY     Current Outpatient Medications  Medication Sig Dispense Refill   carvedilol (COREG) 6.25 MG tablet Take 1 tablet (6.25 mg total) by mouth 2 (two) times daily. 180 tablet 2   isosorbide mononitrate (IMDUR) 120 MG 24 hr tablet Take 1 tablet (120 mg total) by mouth daily. 90 tablet 2   nitroGLYCERIN (NITROSTAT) 0.4 MG SL tablet Place 1 tablet (0.4 mg total) under the tongue every 5 (five) minutes as needed for chest pain. 25 tablet 3   aspirin 81 MG chewable tablet Chew 81 mg by mouth daily.      hydrALAZINE (APRESOLINE) 10 MG tablet Take 10 mg every 12 hours if needed for Systolic Blood Pressure greater than 180 60 tablet 6   No current facility-administered medications for this visit.    Allergies:   Prednisone, Codeine, and Levofloxacin   ROS:  Please see the history of present illness.   Otherwise, review of systems are positive for none.   All other systems are reviewed and negative.    PHYSICAL EXAM: VS:  BP (!) 145/72   Pulse 95   Ht '5\' 2"'$  (1.575 m)   Wt 117 lb (53.1 kg)   BMI 21.40  kg/m  , BMI Body mass index is 21.4 kg/m. GENERAL:  Well appearing NECK:  No jugular venous distention, waveform within normal limits, carotid upstroke brisk and symmetric, no bruits, no thyromegaly LUNGS:  Clear to auscultation bilaterally CHEST:  Unremarkable HEART:  PMI not displaced or sustained,S1 and S2 within normal limits, no S3, no S4, no clicks, no rubs, no murmurs ABD:  Flat, positive bowel sounds normal in frequency in pitch, no bruits, no rebound, no guarding, no midline pulsatile mass, no hepatomegaly, no splenomegaly EXT:  2 plus pulses throughout, no edema, no cyanosis no clubbing   EKG:  EKG is not ordered today.   Recent Labs: No results found for requested labs within last 365 days.    Lipid Panel    Component Value Date/Time   CHOL 189  06/10/2018 1513   TRIG 319 (H) 06/10/2018 1513   HDL 34 (L) 06/10/2018 1513   CHOLHDL 5.6 (H) 06/10/2018 1513   LDLCALC 91 06/10/2018 1513      Wt Readings from Last 3 Encounters:  03/03/22 117 lb (53.1 kg)  09/26/21 120 lb (54.4 kg)  07/25/21 118 lb 9.6 oz (53.8 kg)      Other studies Reviewed: Additional studies/ records that were reviewed today include: Labs Review of the above records demonstrates:  Please see elsewhere in the note.     ASSESSMENT AND PLAN:  CAD:     She is starting to have some neck pain which is unstable angina sure.  We talked about the possibility of a cath but she really would not want this.  Her son who is with her agrees they want more conservative management.  I am going to try to increase the Imdur to 120 mg daily and the carvedilol to 6.25 mg twice daily.  She is going to get a prescription for sublingual nitroglycerin.  We talked about how to take this.  I will see her back in about 10 days.   HTN:    The blood pressure is okay and should tolerate the med changes as listed.  I reiterated that she should take the hydralazine if her systolic is greater than 433 which has happened in the past.   Dyslipidemia: No change in therapy   Current medicines are reviewed at length with the patient today.  The patient does not have concerns regarding medicines.  The following changes have been made: None  Labs/ tests ordered today include: None No orders of the defined types were placed in this encounter.    Disposition:   FU with me in 12 months.    Signed, Minus Breeding, MD  03/03/2022 2:16 PM    [facility]

## 2022-03-03 ENCOUNTER — Encounter: Payer: Self-pay | Admitting: Cardiology

## 2022-03-03 ENCOUNTER — Ambulatory Visit: Payer: Medicare Other | Attending: Cardiology | Admitting: Cardiology

## 2022-03-03 VITALS — BP 145/72 | HR 95 | Ht 62.0 in | Wt 117.0 lb

## 2022-03-03 DIAGNOSIS — I10 Essential (primary) hypertension: Secondary | ICD-10-CM

## 2022-03-03 DIAGNOSIS — I25110 Atherosclerotic heart disease of native coronary artery with unstable angina pectoris: Secondary | ICD-10-CM | POA: Diagnosis not present

## 2022-03-03 DIAGNOSIS — E785 Hyperlipidemia, unspecified: Secondary | ICD-10-CM | POA: Diagnosis not present

## 2022-03-03 MED ORDER — CARVEDILOL 6.25 MG PO TABS: 6.2500 mg | ORAL_TABLET | Freq: Two times a day (BID) | ORAL | 2 refills | Status: DC

## 2022-03-03 MED ORDER — NITROGLYCERIN 0.4 MG SL SUBL: 0.4000 mg | SUBLINGUAL_TABLET | SUBLINGUAL | 3 refills | Status: DC | PRN

## 2022-03-03 MED ORDER — ISOSORBIDE MONONITRATE ER 120 MG PO TB24: 120.0000 mg | ORAL_TABLET | Freq: Every day | ORAL | 2 refills | Status: DC

## 2022-03-03 NOTE — Patient Instructions (Addendum)
Medication Instructions:    Changes to these medications   Increase Imdur ( isosorbide mononitrate ) 120 mg  one tablet daily  Carvedilol 6.25 mg  one tablet  twice daily       If you have the neck pian - your to use the nitroglycerin 0.4 mg tablet underneath your tongue  5 min apart up to 3 times with each episode. If no relief call 911  If blood pressure systolic reaches 416 or higher( top number) you can take Hydralazine 10 mg tablet .   *If you need a refill on your cardiac medications before your next appointment, please call your pharmacy*   Lab Work:  Not needed   Testing/Procedures: Not needed    Follow-Up: At [facility], you and your health needs are our priority.  As part of our continuing mission to provide you with exceptional heart care, we have created designated Provider Care Teams.  These Care Teams include your primary Cardiologist (physician) and Advanced Practice Providers (APPs -  Physician Assistants and Nurse Practitioners) who all work together to provide you with the care you need, when you need it.  We recommend signing up for the patient portal called "MyChart".  Sign up information is provided on this After Visit Summary.  MyChart is used to connect with patients for Virtual Visits (Telemedicine).  Patients are able to view lab/test results, encounter notes, upcoming appointments, etc.  Non-urgent messages can be sent to your provider as well.   To learn more about what you can do with MyChart, go to [email or web address].    Your next appointment:   10 day(s)  The format for your next appointment:   In Person  Provider:   Minus Breeding, MD

## 2022-03-19 DIAGNOSIS — I200 Unstable angina: Secondary | ICD-10-CM | POA: Insufficient documentation

## 2022-03-19 NOTE — Progress Notes (Unsigned)
Cardiology Office Note   Date:  03/20/2022   ID:  Liann Spaeth [facility], [place] 07-13-1926, MRN [id number]  PCP:  Madison Hickman, FNP  Cardiologist:   Minus Breeding, MD Referring:  Madison Hickman, FNP  Chief Complaint  Patient presents with   Coronary Artery Disease      History of Present Illness: Shannon Flowers is a 88 y.o. female who presents for evaluation of CAD.   She had non-ST elevation MI November 2017.  She hypertension and hyperlipidemia.  Looking back through the chart I can find some description of her cardiac cath.  She had LAD calcification with 95% diffuse disease and a small vessel.  There were luminal irregularities in the circumflex.  The right coronary artery had focal eccentric 80 to 90% stenosis.  She was not thought to have a good lesion for PCI and she was turned down by surgery for having more diffuse disease. She has had chest pain and we opted to manage her conservatively.  I started Imdur and we started PRN hydralazine for SBP greater than 180.    At the last visit she was still having chest pain and she wanted continued medical management.  I increased her Imdur and her carvedilol.  Since then she has been much better.  She has not had any need for sublingual nitroglycerin.  She has had no unstable chest pain.  She has had no need for as needed hydralazine as her blood pressures have been stable.  She is not having any new shortness of breath, PND or orthopnea.  She had no new palpitations, presyncope or syncope.   Past Medical History:  Diagnosis Date   ASHD (arteriosclerotic heart disease) 11/16/2016   CAD (coronary artery disease)    Dyslipidemia 11/16/2016   Non-ST elevation (NSTEMI) myocardial infarction [facility]) 11/16/2016    Past Surgical History:  Procedure Laterality Date   ABDOMINAL HYSTERECTOMY     BLADDER SURGERY     Current Outpatient Medications  Medication Sig Dispense Refill   aspirin 81 MG chewable tablet Chew 81  mg by mouth daily.      carvedilol (COREG) 6.25 MG tablet Take 1 tablet (6.25 mg total) by mouth 2 (two) times daily. 180 tablet 2   hydrALAZINE (APRESOLINE) 10 MG tablet Take 10 mg every 12 hours if needed for Systolic Blood Pressure greater than 180 60 tablet 6   isosorbide mononitrate (IMDUR) 120 MG 24 hr tablet Take 1 tablet (120 mg total) by mouth daily. 90 tablet 2   nitroGLYCERIN (NITROSTAT) 0.4 MG SL tablet Place 1 tablet (0.4 mg total) under the tongue every 5 (five) minutes as needed for chest pain. 25 tablet 3   No current facility-administered medications for this visit.    Allergies:   Prednisone, Codeine, and Levofloxacin   ROS:  Please see the history of present illness.   Otherwise, review of systems are positive for none.   All other systems are reviewed and negative.    PHYSICAL EXAM: VS:  BP 138/74   Pulse (!) 57   Ht 5' (1.524 m)   Wt 117 lb (53.1 kg)   SpO2 96%   BMI 22.85 kg/m  , BMI Body mass index is 22.85 kg/m. GENERAL:  Well appearing NECK:  No jugular venous distention, waveform within normal limits, carotid upstroke brisk and symmetric, no bruits, no thyromegaly LUNGS:  Clear to auscultation bilaterally CHEST:  Diffuse fine crackles  HEART:  PMI not displaced  or sustained,S1 and S2 within normal limits, no S3, no S4, no clicks, no rubs, no murmurs ABD:  Flat, positive bowel sounds normal in frequency in pitch, no bruits, no rebound, no guarding, no midline pulsatile mass, no hepatomegaly, no splenomegaly EXT:  2 plus pulses throughout, no edema, no cyanosis no clubbing    EKG:  EKG is not ordered today.   Recent Labs: No results found for requested labs within last 365 days.    Lipid Panel    Component Value Date/Time   CHOL 189 06/10/2018 1513   TRIG 319 (H) 06/10/2018 1513   HDL 34 (L) 06/10/2018 1513   CHOLHDL 5.6 (H) 06/10/2018 1513   LDLCALC 91 06/10/2018 1513      Wt Readings from Last 3 Encounters:  03/20/22 117 lb (53.1 kg)   03/03/22 117 lb (53.1 kg)  09/26/21 120 lb (54.4 kg)      Other studies Reviewed: Additional studies/ records that were reviewed today include: None Review of the above records demonstrates:  Please see elsewhere in the note.     ASSESSMENT AND PLAN:  CAD:     The patient has no new sypmtoms.  No further cardiovascular testing is indicated.  We will continue with aggressive risk reduction and meds as listed.  Her exertional chest pain pattern seems to be improved.  No change in therapy.   HTN:    The blood pressure is at target.  I did review a blood pressure diary.  I think is been running reasonably well with systolics in the 616W with occasional low blood pressures.  She is not having any symptoms.  Continue current meds.   Current medicines are reviewed at length with the patient today.  The patient does not have concerns regarding medicines.  The following changes have been made: None  Labs/ tests ordered today include:  None No orders of the defined types were placed in this encounter.    Disposition:   FU with me in 6 months.    Signed, Minus Breeding, MD  03/20/2022 12:07 PM    [place]

## 2022-03-20 ENCOUNTER — Encounter: Payer: Self-pay | Admitting: Cardiology

## 2022-03-20 ENCOUNTER — Ambulatory Visit: Payer: Medicare Other | Attending: Cardiology | Admitting: Cardiology

## 2022-03-20 VITALS — BP 138/74 | HR 57 | Ht 60.0 in | Wt 117.0 lb

## 2022-03-20 DIAGNOSIS — I200 Unstable angina: Secondary | ICD-10-CM | POA: Insufficient documentation

## 2022-03-20 DIAGNOSIS — I25110 Atherosclerotic heart disease of native coronary artery with unstable angina pectoris: Secondary | ICD-10-CM | POA: Diagnosis not present

## 2022-03-20 DIAGNOSIS — E785 Hyperlipidemia, unspecified: Secondary | ICD-10-CM | POA: Diagnosis not present

## 2022-03-20 NOTE — Patient Instructions (Signed)
Medication Instructions:  Your physician recommends that you continue on your current medications as directed. Please refer to the Current Medication list given to you today.  *If you need a refill on your cardiac medications before your next appointment, please call your pharmacy*  Lab Work: NONE ordered at this time of appointment   If you have labs (blood work) drawn today and your tests are completely normal, you will receive your results only by: [place] (if you have MyChart) OR A paper copy in the mail If you have any lab test that is abnormal or we need to change your treatment, we will call you to review the results.  Testing/Procedures: NONE ordered at this time of appointment   Follow-Up: At [facility], you and your health needs are our priority.  As part of our continuing mission to provide you with exceptional heart care, we have created designated Provider Care Teams.  These Care Teams include your primary Cardiologist (physician) and Advanced Practice Providers (APPs -  Physician Assistants and Nurse Practitioners) who all work together to provide you with the care you need, when you need it.  We recommend signing up for the patient portal called "MyChart".  Sign up information is provided on this After Visit Summary.  MyChart is used to connect with patients for Virtual Visits (Telemedicine).  Patients are able to view lab/test results, encounter notes, upcoming appointments, etc.  Non-urgent messages can be sent to your provider as well.   To learn more about what you can do with MyChart, go to [email or web address].    Your next appointment:   6 month(s)  The format for your next appointment:   In Person  Provider:   Minus Breeding, MD     Other Instructions   Important Information About Sugar

## 2022-03-27 ENCOUNTER — Ambulatory Visit: Payer: Medicare Other | Admitting: Cardiology

## 2022-09-17 NOTE — Progress Notes (Unsigned)
  Cardiology Office Note:   Date:  09/18/2022  ID:  Isadora Roylance [facility], [place] 1927/01/22, MRN [id number]  History of Present Illness:   Shannon Flowers is a 89 y.o. female who presents for evaluation of CAD.    She had LAD calcification with 95% diffuse disease and a small vessel.  There were luminal irregularities in the circumflex.  The right coronary artery had focal eccentric 80 to 90% stenosis.  She was not thought to have a good lesion for PCI and she was turned down by surgery for having more diffuse disease. She has had chest pain and we opted to manage her conservatively.  I started Imdur and we started PRN hydralazine for SBP greater than 180.     The patient has done well since I saw her.  She has not had any new chest pressure, neck or arm discomfort.  She has had no new shortness of breath, PND or orthopnea.  She has had no palpitations, presyncope or syncope.  She still riding her 4 wheeler and her tractor to mow her lawn.  ROS: As stated in the HPI and negative for all other systems.  Studies Reviewed:    EKG: Sinus rhythm, rate 52, axis within normal limits, intervals within normal limits, no acute ST-T wave changes.  There is left ventricular hypertrophy by voltage criteria with some repolarization changes.  She has evidence of an old anteroseptal MI.  None of this is changed from previous.  Risk Assessment/Calculations:         Physical Exam:   VS:  BP 122/70   Pulse (!) 52   Ht 5' (1.524 m)   Wt 116 lb 12.8 oz (53 kg)   SpO2 95%   BMI 22.81 kg/m    Wt Readings from Last 3 Encounters:  09/18/22 116 lb 12.8 oz (53 kg)  03/20/22 117 lb (53.1 kg)  03/03/22 117 lb (53.1 kg)    GEN: Well nourished, well developed in no acute distress NECK: No JVD; No carotid bruits CARDIAC: I I review that through [employer] it is nice if you become RRR, no murmurs, rubs, gallops RESPIRATORY:  Clear to auscultation without rales, wheezing or rhonchi  ABDOMEN: Soft,  non-tender, non-distended EXTREMITIES:  No edema; No deformity   ASSESSMENT AND PLAN:   CAD:   The patient has no new sypmtoms.  No further cardiovascular testing is indicated.  We will continue with aggressive risk reduction and meds as listed.she does well with the addition of Imdur.  No change in therapy.   HTN:    The blood pressure is at target.  No change in therapy.   She rarely has to use as needed hydralazine.     Signed, Rollene Rotunda, MD

## 2022-09-18 ENCOUNTER — Encounter: Payer: Self-pay | Admitting: Cardiology

## 2022-09-18 ENCOUNTER — Ambulatory Visit: Payer: Medicare Other | Attending: Cardiology | Admitting: Cardiology

## 2022-09-18 VITALS — BP 122/70 | HR 52 | Ht 60.0 in | Wt 116.8 lb

## 2022-09-18 DIAGNOSIS — I2510 Atherosclerotic heart disease of native coronary artery without angina pectoris: Secondary | ICD-10-CM

## 2022-09-18 DIAGNOSIS — I10 Essential (primary) hypertension: Secondary | ICD-10-CM

## 2022-09-18 NOTE — Patient Instructions (Addendum)
Medication Instructions:  Your physician recommends that you continue on your current medications as directed. Please refer to the Current Medication list given to you today.  *If you need a refill on your cardiac medications before your next appointment, please call your pharmacy*  Follow-Up: At [facility], you and your health needs are our priority.  As part of our continuing mission to provide you with exceptional heart care, we have created designated Provider Care Teams.  These Care Teams include your primary Cardiologist (physician) and Advanced Practice Providers (APPs -  Physician Assistants and Nurse Practitioners) who all work together to provide you with the care you need, when you need it.  We recommend signing up for the patient portal called "MyChart".  Sign up information is provided on this After Visit Summary.  MyChart is used to connect with patients for Virtual Visits (Telemedicine).  Patients are able to view lab/test results, encounter notes, upcoming appointments, etc.  Non-urgent messages can be sent to your provider as well.   To learn more about what you can do with MyChart, go to [email or web address].    Your next appointment:   12 month(s)  Provider:   James Hochrein, MD     

## 2022-12-07 ENCOUNTER — Telehealth: Payer: Self-pay

## 2022-12-07 NOTE — Telephone Encounter (Signed)
   Patient Name: Shannon Flowers  DOB: [birth year] MRN: [id number]  Primary Cardiologist: Rollene Rotunda, MD  Chart reviewed as part of pre-operative protocol coverage. Cataract extractions are recognized in guidelines as low risk surgeries that do not typically require specific preoperative testing or holding of blood thinner therapy. Therefore, given past medical history and time since last visit, based on ACC/AHA guidelines, Shannon Flowers would be at acceptable risk for the planned procedure without further cardiovascular testing.   I will route this recommendation to the requesting party via Epic fax function and remove from pre-op pool.  Please call with questions.  Carlos Levering, NP 12/07/2022, 1:10 PM

## 2022-12-07 NOTE — Telephone Encounter (Signed)
   Pre-operative Risk Assessment    Patient Name: Shannon Flowers  DOB: [birth year] MRN: [id number]      Request for Surgical Clearance    Procedure:   Cataract extraction w/ Intraocular Lens Implantation of the right eye  Date of Surgery:  Clearance 01/04/23                                 Surgeon:  Not Indicated Surgeon's Group or Practice Name:  [facility] Phone number:  [phone] Fax number:  [phone]   Type of Clearance Requested:   - Medical ; Pt also on Aspirin, hold not requested   Type of Anesthesia:   Topical anesthesia w/ IV medication   Additional requests/questions:    Garrel Ridgel   12/07/2022, 10:11 AM

## 2022-12-22 ENCOUNTER — Other Ambulatory Visit: Payer: Self-pay | Admitting: Cardiology

## 2023-01-22 ENCOUNTER — Other Ambulatory Visit: Payer: Self-pay | Admitting: Cardiology

## 2023-05-27 ENCOUNTER — Emergency Department (HOSPITAL_COMMUNITY): Payer: Medicare Other

## 2023-05-27 ENCOUNTER — Other Ambulatory Visit: Payer: Self-pay

## 2023-05-27 ENCOUNTER — Inpatient Hospital Stay (HOSPITAL_COMMUNITY)
Admission: EM | Admit: 2023-05-27 | Discharge: 2023-05-29 | DRG: 951 | Disposition: A | Discharge status: Hospice | Payer: Medicare Other | Attending: Internal Medicine | Admitting: Internal Medicine

## 2023-05-27 ENCOUNTER — Encounter (HOSPITAL_COMMUNITY): Payer: Self-pay

## 2023-05-27 DIAGNOSIS — Z79899 Other long term (current) drug therapy: Secondary | ICD-10-CM

## 2023-05-27 DIAGNOSIS — Z823 Family history of stroke: Secondary | ICD-10-CM | POA: Diagnosis not present

## 2023-05-27 DIAGNOSIS — Z885 Allergy status to narcotic agent status: Secondary | ICD-10-CM

## 2023-05-27 DIAGNOSIS — I213 ST elevation (STEMI) myocardial infarction of unspecified site: Secondary | ICD-10-CM | POA: Diagnosis present

## 2023-05-27 DIAGNOSIS — I959 Hypotension, unspecified: Secondary | ICD-10-CM | POA: Diagnosis present

## 2023-05-27 DIAGNOSIS — R1310 Dysphagia, unspecified: Secondary | ICD-10-CM | POA: Diagnosis present

## 2023-05-27 DIAGNOSIS — I10 Essential (primary) hypertension: Secondary | ICD-10-CM | POA: Diagnosis present

## 2023-05-27 DIAGNOSIS — I252 Old myocardial infarction: Secondary | ICD-10-CM

## 2023-05-27 DIAGNOSIS — Z66 Do not resuscitate: Secondary | ICD-10-CM | POA: Diagnosis present

## 2023-05-27 DIAGNOSIS — I2510 Atherosclerotic heart disease of native coronary artery without angina pectoris: Secondary | ICD-10-CM | POA: Diagnosis present

## 2023-05-27 DIAGNOSIS — Z881 Allergy status to other antibiotic agents status: Secondary | ICD-10-CM

## 2023-05-27 DIAGNOSIS — R079 Chest pain, unspecified: Secondary | ICD-10-CM | POA: Diagnosis not present

## 2023-05-27 DIAGNOSIS — R55 Syncope and collapse: Secondary | ICD-10-CM | POA: Diagnosis present

## 2023-05-27 DIAGNOSIS — Z888 Allergy status to other drugs, medicaments and biological substances status: Secondary | ICD-10-CM

## 2023-05-27 DIAGNOSIS — I25110 Atherosclerotic heart disease of native coronary artery with unstable angina pectoris: Secondary | ICD-10-CM | POA: Diagnosis present

## 2023-05-27 DIAGNOSIS — G9340 Encephalopathy, unspecified: Secondary | ICD-10-CM | POA: Diagnosis present

## 2023-05-27 DIAGNOSIS — E785 Hyperlipidemia, unspecified: Secondary | ICD-10-CM | POA: Diagnosis present

## 2023-05-27 DIAGNOSIS — Z7982 Long term (current) use of aspirin: Secondary | ICD-10-CM

## 2023-05-27 DIAGNOSIS — R0602 Shortness of breath: Secondary | ICD-10-CM

## 2023-05-27 DIAGNOSIS — I2119 ST elevation (STEMI) myocardial infarction involving other coronary artery of inferior wall: Secondary | ICD-10-CM | POA: Diagnosis present

## 2023-05-27 DIAGNOSIS — Z515 Encounter for palliative care: Principal | ICD-10-CM

## 2023-05-27 DIAGNOSIS — I2111 ST elevation (STEMI) myocardial infarction involving right coronary artery: Secondary | ICD-10-CM | POA: Diagnosis not present

## 2023-05-27 LAB — DIFFERENTIAL
Abs Immature Granulocytes: 0.05 10*3/uL (ref 0.00–0.07)
Basophils Absolute: 0.1 10*3/uL (ref 0.0–0.1)
Basophils Relative: 1 %
Eosinophils Absolute: 0.3 10*3/uL (ref 0.0–0.5)
Eosinophils Relative: 3 %
Immature Granulocytes: 1 %
Lymphocytes Relative: 45 %
Lymphs Abs: 4.6 10*3/uL — ABNORMAL HIGH (ref 0.7–4.0)
Monocytes Absolute: 0.5 10*3/uL (ref 0.1–1.0)
Monocytes Relative: 5 %
Neutro Abs: 4.7 10*3/uL (ref 1.7–7.7)
Neutrophils Relative %: 45 %

## 2023-05-27 LAB — I-STAT CHEM 8, ED
BUN: 22 mg/dL (ref 8–23)
Calcium, Ion: 0.98 mmol/L — ABNORMAL LOW (ref 1.15–1.40)
Chloride: 112 mmol/L — ABNORMAL HIGH (ref 98–111)
Creatinine, Ser: 0.8 mg/dL (ref 0.44–1.00)
Glucose, Bld: 193 mg/dL — ABNORMAL HIGH (ref 70–99)
HCT: 47 % — ABNORMAL HIGH (ref 36.0–46.0)
Hemoglobin: 16 g/dL — ABNORMAL HIGH (ref 12.0–15.0)
Potassium: 3.7 mmol/L (ref 3.5–5.1)
Sodium: 139 mmol/L (ref 135–145)
TCO2: 16 mmol/L — ABNORMAL LOW (ref 22–32)

## 2023-05-27 LAB — CBC
HCT: 50 % — ABNORMAL HIGH (ref 36.0–46.0)
Hemoglobin: 15.8 g/dL — ABNORMAL HIGH (ref 12.0–15.0)
MCH: 31.7 pg (ref 26.0–34.0)
MCHC: 31.6 g/dL (ref 30.0–36.0)
MCV: 100.2 fL — ABNORMAL HIGH (ref 80.0–100.0)
Platelets: 368 10*3/uL (ref 150–400)
RBC: 4.99 MIL/uL (ref 3.87–5.11)
RDW: 14 % (ref 11.5–15.5)
WBC: 10.2 10*3/uL (ref 4.0–10.5)
nRBC: 0 % (ref 0.0–0.2)

## 2023-05-27 LAB — APTT: aPTT: 25 s (ref 24–36)

## 2023-05-27 LAB — COMPREHENSIVE METABOLIC PANEL
ALT: 12 U/L (ref 0–44)
AST: 25 U/L (ref 15–41)
Albumin: 2.8 g/dL — ABNORMAL LOW (ref 3.5–5.0)
Alkaline Phosphatase: 41 U/L (ref 38–126)
Anion gap: 12 (ref 5–15)
BUN: 20 mg/dL (ref 8–23)
CO2: 15 mmol/L — ABNORMAL LOW (ref 22–32)
Calcium: 7.7 mg/dL — ABNORMAL LOW (ref 8.9–10.3)
Chloride: 112 mmol/L — ABNORMAL HIGH (ref 98–111)
Creatinine, Ser: 0.86 mg/dL (ref 0.44–1.00)
GFR, Estimated: >60 mL/min (ref >60)
Glucose, Bld: 193 mg/dL — ABNORMAL HIGH (ref 70–99)
Potassium: 3.7 mmol/L (ref 3.5–5.1)
Sodium: 139 mmol/L (ref 135–145)
Total Bilirubin: 1.1 mg/dL (ref 0.0–1.2)
Total Protein: 5 g/dL — ABNORMAL LOW (ref 6.5–8.1)

## 2023-05-27 LAB — BRAIN NATRIURETIC PEPTIDE: B Natriuretic Peptide: 355.3 pg/mL — ABNORMAL HIGH (ref 0.0–100.0)

## 2023-05-27 LAB — PROTIME-INR
INR: 1.4 — ABNORMAL HIGH (ref 0.8–1.2)
Prothrombin Time: 17.2 s — ABNORMAL HIGH (ref 11.4–15.2)

## 2023-05-27 LAB — ETHANOL: Alcohol, Ethyl (B): <10 mg/dL (ref <10)

## 2023-05-27 LAB — TROPONIN I (HIGH SENSITIVITY): Troponin I (High Sensitivity): 18 ng/L — ABNORMAL HIGH (ref <18)

## 2023-05-27 LAB — CBG MONITORING, ED: Glucose-Capillary: 183 mg/dL — ABNORMAL HIGH (ref 70–99)

## 2023-05-27 MED ORDER — POLYVINYL ALCOHOL 1.4 % OP SOLN: 1.0000 [drp] | Freq: Four times a day (QID) | OPHTHALMIC | Status: DC | PRN

## 2023-05-27 MED ORDER — ACETAMINOPHEN 325 MG PO TABS
650.0000 mg | ORAL_TABLET | Freq: Four times a day (QID) | ORAL | Status: DC | PRN
  Administered 2023-05-28: 650 mg via ORAL
  Filled 2023-05-27: qty 2

## 2023-05-27 MED ORDER — GLYCOPYRROLATE 1 MG PO TABS: 1.0000 mg | ORAL_TABLET | ORAL | Status: DC | PRN

## 2023-05-27 MED ORDER — GLYCOPYRROLATE 0.2 MG/ML IJ SOLN: 0.2000 mg | INTRAMUSCULAR | Status: DC | PRN

## 2023-05-27 MED ORDER — LACTATED RINGERS IV BOLUS
1000.0000 mL | Freq: Once | INTRAVENOUS | Status: AC
  Administered 2023-05-27: 1000 mL via INTRAVENOUS

## 2023-05-27 MED ORDER — ASPIRIN 81 MG PO CHEW
243.0000 mg | CHEWABLE_TABLET | Freq: Once | ORAL | Status: DC
  Filled 2023-05-27: qty 3

## 2023-05-27 MED ORDER — ONDANSETRON HCL 4 MG/2ML IJ SOLN: 4.0000 mg | Freq: Four times a day (QID) | INTRAMUSCULAR | Status: DC | PRN

## 2023-05-27 MED ORDER — ASPIRIN 300 MG RE SUPP
300.0000 mg | Freq: Once | RECTAL | Status: AC
  Administered 2023-05-27: 300 mg via RECTAL

## 2023-05-27 MED ORDER — ALBUTEROL SULFATE (2.5 MG/3ML) 0.083% IN NEBU: 2.5000 mg | INHALATION_SOLUTION | RESPIRATORY_TRACT | Status: DC | PRN

## 2023-05-27 MED ORDER — GUAIFENESIN-DM 100-10 MG/5ML PO SYRP
5.0000 mL | ORAL_SOLUTION | ORAL | Status: DC | PRN
  Administered 2023-05-28: 5 mL via ORAL
  Filled 2023-05-27: qty 5

## 2023-05-27 MED ORDER — ONDANSETRON HCL 4 MG/2ML IJ SOLN
4.0000 mg | Freq: Once | INTRAMUSCULAR | Status: AC
  Administered 2023-05-27: 4 mg via INTRAVENOUS
  Filled 2023-05-27: qty 2

## 2023-05-27 MED ORDER — ACETAMINOPHEN 650 MG RE SUPP: 650.0000 mg | Freq: Four times a day (QID) | RECTAL | Status: DC | PRN

## 2023-05-27 MED ORDER — SODIUM CHLORIDE 0.9% FLUSH
3.0000 mL | Freq: Once | INTRAVENOUS | Status: AC
  Administered 2023-05-27: 3 mL via INTRAVENOUS

## 2023-05-27 MED ORDER — HEPARIN BOLUS VIA INFUSION
2000.0000 [IU] | Freq: Once | INTRAVENOUS | Status: AC
  Administered 2023-05-27: 2000 [IU] via INTRAVENOUS
  Filled 2023-05-27: qty 2000

## 2023-05-27 MED ORDER — HEPARIN (PORCINE) 25000 UT/250ML-% IV SOLN
650.0000 [IU]/h | INTRAVENOUS | Status: DC
  Administered 2023-05-27: 650 [IU]/h via INTRAVENOUS
  Filled 2023-05-27: qty 250

## 2023-05-27 NOTE — Progress Notes (Signed)
Chaplain responds to code STEMI and provides compassionate presence as pt receives medical care. Family members are escorted to room by another staff member; no pastoral needs.

## 2023-05-27 NOTE — ED Notes (Signed)
Pt finished receiving the 1L NS fluids started by EMS now.

## 2023-05-27 NOTE — H&P (Addendum)
Cardiology Admission History and Physical   Patient ID: Shannon Flowers MRN: [id number]; DOB: 1927/01/16   Admission date: 05/27/2023  PCP:  Jerrye Bushy, FNP   [facility] Providers Cardiologist:  Rollene Rotunda, MD        Chief Complaint:  STEMI  Patient Profile:   Shannon Flowers is a 89 y.o. female with extensive and diffuse CAD treated medically, dyslipidemia who has refused statins in the past, who is being seen 05/27/2023 for the evaluation of STEMI.  History of Present Illness:   [name] was in her usual state of health yesterday.  She is generally able to do her own housework and other things such as going to the grocery store.  She has not recently complained of chest pain and shortness of breath.  This a.m. at 930, she called her niece who lives about 12 minutes away and said I am having chest pain, please call him.  The niece contacted another relative who is her neighbor.  The neighbor went to see her and she found her on the floor with decreased level of consciousness and complaining of chest pain as well as nausea and dry heaves.  911 was called and 911 instructed her to do CPR which she did until EMS arrived.  The relative says that the patient never actually lost pulses but had greatly decreased level of consciousness.  EMS did not give her oral medications as her level consciousness was not high enough.  She was hydrated for hypotension with a systolic blood pressure of 90 although she was initially hypertensive with 164/84.  Seeing her in the ER with family present, she says she is not having pain now.  She is not able to talk much which is different from her usual ability.  The fact that she is DNR/DNI was discussed with her niece, and she states that that is still her wish.  Because of her condition and comorbidities, she is not a cath candidate.  After further discussions with the family members present, the decision  was made to pursue comfort care.  They do not wish hospice brought in today, perhaps tomorrow.  She is currently resting more comfortably and her blood pressure has improved proved with IV hydration.  At some point, a code stroke was called and Neurology saw the patient.  The Neurologist is in agreement with comfort care.   Past Medical History:  Diagnosis Date   ASHD (arteriosclerotic heart disease) 11/16/2016   CAD (coronary artery disease)    Dyslipidemia 11/16/2016   Non-ST elevation (NSTEMI) myocardial infarction [facility]) 11/16/2016    Past Surgical History:  Procedure Laterality Date   ABDOMINAL HYSTERECTOMY     BLADDER SURGERY       Medications Prior to Admission: Prior to Admission medications   Medication Sig Start Date End Date Taking? Authorizing Provider  aspirin 81 MG chewable tablet Chew 81 mg by mouth daily.  04/01/16   [provider]  carvedilol (COREG) 6.25 MG tablet TAKE 1 TABLET BY MOUTH TWICE DAILY. 01/23/23   Rollene Rotunda, MD  ezetimibe (ZETIA) 10 MG tablet Take 10 mg by mouth daily. 05/04/17   [provider]  hydrALAZINE (APRESOLINE) 10 MG tablet Take 10 mg every 12 hours if needed for Systolic Blood Pressure greater than 180 07/25/21   Rollene Rotunda, MD  isosorbide mononitrate (IMDUR) 120 MG 24 hr tablet TAKE 1 TABLET BY MOUTH ONCE DAILY. 12/22/22   Rollene Rotunda, MD  nitroGLYCERIN (  NITROSTAT) 0.4 MG SL tablet Place 1 tablet (0.4 mg total) under the tongue every 5 (five) minutes as needed for chest pain. 03/03/22   Rollene Rotunda, MD     Allergies:    Allergies  Allergen Reactions   Prednisone Nausea Only    insomnia   Codeine Nausea And Vomiting   Levofloxacin Diarrhea    Levaquin    Social History:   Social History   Socioeconomic History   Marital status: Widowed    Spouse name: Not on file   Number of children: Not on file   Years of education: Not on file   Highest education level: Not on file  Occupational History    Not on file  Tobacco Use   Smoking status: Never   Smokeless tobacco: Never  Vaping Use   Vaping status: Never Used  Substance and Sexual Activity   Alcohol use: No   Drug use: No   Sexual activity: Not on file  Other Topics Concern   Not on file  Social History Narrative   Lives.  One child and one grand who is an NP.    Social Drivers of Corporate investment banker Strain: Not on file  Food Insecurity: Not on file  Transportation Needs: Not on file  Physical Activity: Not on file  Stress: Not on file  Social Connections: Not on file  Intimate Partner Violence: Not on file    Family History:   The patient's family history includes Stroke in her father.    ROS:  Please see the history of present illness.  All other ROS reviewed and negative.     Physical Exam/Data:   Vitals:   05/27/23 1130 05/27/23 1145 05/27/23 1200 05/27/23 1211  BP: (!) 72/58 (!) 70/59 (!) 68/49   Pulse: (!) 58 (!) 57 (!) 55   Resp: 16 (!) 25 16   Temp:    (!) 96.4 F (35.8 C)  TempSrc:    Rectal  SpO2: 99% 99% 98%   Weight:      Height:       No intake or output data in the 24 hours ending 05/27/23 1215    05/27/2023   11:15 AM 09/18/2022   11:25 AM 03/20/2022   11:35 AM  Last 3 Weights  Weight (lbs) 115 lb 116 lb 12.8 oz 117 lb  Weight (kg) 52.164 kg 52.98 kg 53.071 kg     Body mass index is 23.23 kg/m.  General: Frail, elderly female, at first appears acutely uncomfortable, but then improved HEENT: normal for age Neck:  JVD 10 mm Vascular: No carotid bruits; Distal pulses 2+ bilaterally   Cardiac:  normal S1, S2; RRR; no murmur  Lungs: Rales bases bilaterally, no wheezing, rhonchi   Abd: soft, nontender, no hepatomegaly  Ext: no edema Musculoskeletal: Chronic deformities to both big toes, BUE and BLE strength not able to be tested Skin: Diaphoretic and pale Neuro: no focal abnormalities noted   EKG:  The ECG that was done today was personally reviewed and demonstrates  sinus bradycardia, heart rate 56, significant inferior ST elevation  Relevant CV Studies:  CARDIAC CATH: 03/31/2016  Cardiac Arteries and Lesion Findings  LMCA: 30-40% distal LM stenosis  LAD: Heavily calcified 95% diffusely diseased vessel ( small vessel)  LCx: Mild luminal irregularities < 30%  RCA: focal eccentric 80-90% stenosis ( calcified)  Heavily calcified  Seen by CT surgery, not a candidate for CABG Dr. Sigurd Sos discussed with her medical therapy versus high  risk PCI >> patient opted for medical therapy  Laboratory Data:  High Sensitivity Troponin:   Recent Labs  Lab 05/27/23 1050  TROPONINIHS 18*      Chemistry Recent Labs  Lab 05/27/23 1050 05/27/23 1053  NA 139 139  K 3.7 3.7  CL 112* 112*  CO2 15*  --   GLUCOSE 193* 193*  BUN 20 22  CREATININE 0.86 0.80  CALCIUM 7.7*  --   GFRNONAA >60  --   ANIONGAP 12  --     Recent Labs  Lab 05/27/23 1050  PROT 5.0*  ALBUMIN 2.8*  AST 25  ALT 12  ALKPHOS 41  BILITOT 1.1   Lipids No results for input(s): "CHOL", "TRIG", "HDL", "LABVLDL", "LDLCALC", "CHOLHDL" in the last 168 hours. Hematology Recent Labs  Lab 05/27/23 1050 05/27/23 1053  WBC 10.2  --   RBC 4.99  --   HGB 15.8* 16.0*  HCT 50.0* 47.0*  MCV 100.2*  --   MCH 31.7  --   MCHC 31.6  --   RDW 14.0  --   PLT 368  --    Thyroid No results for input(s): "TSH", "FREET4" in the last 168 hours. BNPNo results for input(s): "BNP", "PROBNP" in the last 168 hours.  DDimer No results for input(s): "DDIMER" in the last 168 hours.   Radiology/Studies:  DG Chest Portable 1 View Result Date: 05/27/2023 CLINICAL DATA:  89 year old female with history of chest pain and shortness of breath. EXAM: PORTABLE CHEST 1 VIEW COMPARISON:  Chest x-ray 02/19/2021. FINDINGS: Chronic elevation of the right hemidiaphragm. Extensive interstitial prominence and widespread peribronchial cuffing throughout the lungs bilaterally (left-greater-than-right). No confluent  consolidative airspace disease. No pleural effusions. No pneumothorax. No evidence of pulmonary edema. Heart size is normal. Upper mediastinal contours are within normal limits. Atherosclerotic calcifications in the thoracic aorta. IMPRESSION: 1. The appearance of the chest is concerning for bronchitis and possible developing bronchopneumonia, as above. 2. Aortic atherosclerosis. Electronically Signed   By: Trudie Reed M.D.   On: 05/27/2023 11:55     Assessment and Plan:   Inferior STEMI -She is clearly having a STEMI in the area where she has known severe disease. -7 years ago, she opted for medical therapy -At this time, because of her condition and comorbidities, she is not a candidate for any invasive workup. -After discussion with her niece and the other family member present, the decision has been made to pursue Comfort Care -The family does not want hospice to be involved right now, they may wish that in the morning. -She will be started on IV heparin, she will be started on Plavix once she is able to swallow -Her blood pressure is not high enough to add IV nitroglycerin, so we will hold off on this -She has refused statin therapy in the past, we will not reattempt -Add beta-blocker as her blood pressure and heart rate will tolerate  2.  Code stroke -A code stroke was activated because of mental status changes -She was seen by Neurology, but once they were notified at that Comfort Care is preferred, all further evaluation was canceled.  3.  Possible URI -The family describes a minor cough that has been going on for about a week -Chest x-ray is pending but she has possible volume overload by exam -For right now, she is getting hydrated because of hypotension. -If she has heart failure on labs or chest x-ray, we will need to cut back on the hydration and perhaps  give some IV Lasix.   Risk Assessment/Risk Scores:    TIMI Risk Score for ST  Elevation MI:   The patient's TIMI  risk score is 10, which indicates a 35.9% risk of all cause mortality at 30 days.     Code Status: Do Not Resuscitate (DNR)   For questions or updates, please contact [facility] Please consult [email or web address] for contact info under     Signed, Theodore Demark, PA-C  05/27/2023 12:15 PM    ATTENDING ATTESTATION:  After conducting a review of all available clinical information with the care team, interviewing the patient, and performing a physical exam, I agree with the findings and plan described in this note.   GEN: No acute distress, cachetic HEENT:  Dry MM, no JVD, no scleral icterus Cardiac: RRR, no murmurs, rubs, or gallops.  Respiratory: Coarse GI: Soft, nontender, non-distended  MS: No edema; No deformity. Neuro:  Nonfocal  Vasc:  +1 radial pulses  Patient is a 89F with known severe and calcified RCA disease, calcified LAD disease, and distal LM disease on coronary angiography 2017 ([facility]), HL here with inferior STEMI.  Patient is DNR DNI, frail, and has refused statins in the past.  Spoke with familly at bedside and gave my recommendation that patient be treated medically.  Will admit to 2H and treat witih heparin gtt.  Will start plavix if/when the patient can take PO meds (has difficulty swallowing right now).  No statins due to patient preference.  Patient's family has elected for CMO and we will abide by this.    Alverda Skeans, MD Pager [phone]

## 2023-05-27 NOTE — Consult Note (Addendum)
NEUROLOGY CONSULT NOTE   Date of service: May 27, 2023 Patient Name: Shannon Flowers MRN:  [id number] DOB:  10-02-1926 Chief Complaint: "chest pain" Requesting Provider: Loetta Rough, MD  History of Present Illness  Shannon Flowers is a 89 y.o. female  has a past medical history of ASHD (arteriosclerotic heart disease) (11/16/2016), CAD (coronary artery disease), Dyslipidemia (11/16/2016), and Non-ST elevation (NSTEMI) myocardial infarction (HCC) (11/16/2016).     History provided primarily by family and EMS given patient unstable  In brief at baseline she is fully functional, still driving, managing finances, uses a cane at times for ambulation.  Per [name], listed as daughter in our EMR but reported as a niece by EMS, patient has been well other than a URI with primary symptoms of cough.  Was actually feeling overall better yesterday other than lingering cough, no fever, no chills, no nausea, no vomiting, no diarrhea.  This morning called Ms. Ray at 9:30 AM reporting "I need to" and then hung up.  Ms. Rosalia Hammers asked a neighbor to check on her while she was en route, neighbor reported that the patient was feeling like she was having heart issues and therefore Ms. Ray asked neighbor to activate EMS  EMS reports on their arrival neighbor was performing CPR.  Subsequently patient was awake but not very alert.  They presumed a fall given the patient was on the ground.  Blood pressures and heart rates were variable ranging from 160s over 80s to 90s over 50s, heart rates intermittently into the 20s and 30s at times and patient diaphoretic.  No focal deficits for EMS    LKW: 5:30 PM by a neighbor Modified rankin score: 0-Completely asymptomatic and back to baseline post- stroke IV Thrombolysis: No, concern for STEMI EVT: No, exam not consistent with LVO   NIHSS components Score: Comment  1a Level of Conscious 0[]  1[x]  2[]  3[]      1b LOC Questions 0[]  1[x]  2[]      Reported month is December  1c LOC Commands 0[x]  1[]  2[]       2 Best Gaze 0[x]  1[]  2[]       3 Visual 0[x]  1[]  2[]  3[]      4 Facial Palsy 0[x]  1[]  2[]  3[]      5a Motor Arm - left 0[x]  1[]  2[]  3[]  4[]  UN[]    5b Motor Arm - Right 0[x]  1[]  2[]  3[]  4[]  UN[]    6a Motor Leg - Left 0[x]  1[]  2[]  3[]  4[]  UN[]    6b Motor Leg - Right 0[x]  1[]  2[]  3[]  4[]  UN[]    7 Limb Ataxia 0[]  1[]  2[]  3[]  UN[x]   Not tested as patient unstable  8 Sensory 0[x]  1[]  2[]  UN[]      9 Best Language 0[]  1[]  2[]  3[]    Incompletely tested, unstable patient, but no obvious aphasia  10 Dysarthria 0[]  1[x]  2[]  UN[]      11 Extinct. and Inattention 0[x]  1[]  2[]       TOTAL:       ROS  Limited due to unstable patient  Past History   Past Medical History:  Diagnosis Date   ASHD (arteriosclerotic heart disease) 11/16/2016   CAD (coronary artery disease)    Dyslipidemia 11/16/2016   Non-ST elevation (NSTEMI) myocardial infarction (HCC) 11/16/2016    Past Surgical History:  Procedure Laterality Date   ABDOMINAL HYSTERECTOMY     BLADDER SURGERY      Family History: Family History  Problem Relation Age of Onset   Stroke Father  Social History  reports that she has never smoked. She has never used smokeless tobacco. She reports that she does not drink alcohol and does not use drugs.  Allergies  Allergen Reactions   Prednisone Nausea Only    insomnia   Codeine Nausea And Vomiting   Levofloxacin Diarrhea    Levaquin    Medications   Current Facility-Administered Medications:    aspirin chewable tablet 243 mg, 243 mg, Oral, Once, Loetta Rough, MD   ondansetron (ZOFRAN) injection 4 mg, 4 mg, Intravenous, Once, Loetta Rough, MD   sodium chloride flush (NS) 0.9 % injection 3 mL, 3 mL, Intravenous, Once, Loetta Rough, MD  Current Outpatient Medications:    aspirin 81 MG chewable tablet, Chew 81 mg by mouth daily. , Disp: , Rfl:    carvedilol (COREG) 6.25 MG tablet, TAKE 1 TABLET BY MOUTH TWICE  DAILY., Disp: 180 tablet, Rfl: 3   ezetimibe (ZETIA) 10 MG tablet, Take 10 mg by mouth daily., Disp: , Rfl:    hydrALAZINE (APRESOLINE) 10 MG tablet, Take 10 mg every 12 hours if needed for Systolic Blood Pressure greater than 180, Disp: 60 tablet, Rfl: 6   isosorbide mononitrate (IMDUR) 120 MG 24 hr tablet, TAKE 1 TABLET BY MOUTH ONCE DAILY., Disp: 90 tablet, Rfl: 2   nitroGLYCERIN (NITROSTAT) 0.4 MG SL tablet, Place 1 tablet (0.4 mg total) under the tongue every 5 (five) minutes as needed for chest pain., Disp: 25 tablet, Rfl: 3  Vitals  There were no vitals filed for this visit.  There is no height or weight on file to calculate BMI.  Physical Exam   Constitutional: Appears diaphoretic, acutely ill Psych: Affect flat, cooperative Eyes: No scleral injection.  HENT: No OP obstruction.  Head: Normocephalic.  Cardiovascular: Intermittently bradycardia to 20s Respiratory: Somewhat tachypneic  Neurologic Examination   See NIH stroke scale documented above, full neurological examination deferred for cardiac stabilization  Labs/Imaging/Neurodiagnostic studies   CBC:  Recent Labs  Lab 22-Jun-2023 1050 06-22-23 1053  WBC 10.2  --   NEUTROABS 4.7  --   HGB 15.8* 16.0*  HCT 50.0* 47.0*  MCV 100.2*  --   PLT 368  --    Basic Metabolic Panel:  Lab Results  Component Value Date   NA 139 06/22/2023   K 3.7 06/22/23   CO2 24 06/10/2018   GLUCOSE 193 (H) 06/22/2023   BUN 22 06-22-2023   CREATININE 0.80 06-22-23   CALCIUM 10.3 06/10/2018   GFRNONAA 58 (L) 06/10/2018   GFRAA 67 06/10/2018   Lipid Panel:  Lab Results  Component Value Date   LDLCALC 91 06/10/2018     ASSESSMENT   Shannon Flowers is a 89 y.o. female  has a past medical history of ASHD (arteriosclerotic heart disease) (11/16/2016), CAD (coronary artery disease), Dyslipidemia (11/16/2016), and Non-ST elevation (NSTEMI) myocardial infarction (HCC) (11/16/2016).   Suspect neurological issues of  fluctuating mental status secondary to her intermittent hypotension and bradycardia,  Would prioritize cardiac workup at this point.  No history or examination findings suggestive of focal neurologic deficit  RECOMMENDATIONS  -Appreciate cardiac stabilization per ED including activation of code STEMI -Head CT when stable from a cardiac perspective -Every 2 hr neurochecks as cardiac stabilization allows -If neurological concerns remain after patient is stabilized, please reach out to neurology, consider MRI brain -Neurology will be available as needed, discussed with Dr. Gayleen Orem at bedside  Addendum, notified by cardiology team that patient's family has requested comfort  care measures.  Neurology will sign off at this time, code stroke should be canceled in the setting of transition to comfort care ______________________________________________________________________   Brooke Dare MD-PhD [facility] [phone] Available 7 AM to 7 PM, outside these hours please contact Neurologist on call listed on AMION   CRITICAL CARE Performed by: Gordy Councilman   Total critical care time: 30 minutes  Critical care time was exclusive of separately billable procedures and treating other patients.  Critical care was necessary to treat or prevent imminent or life-threatening deterioration.  Critical care was time spent personally by me on the following activities: development of treatment plan with patient and/or surrogate as well as nursing, discussions with consultants, evaluation of patient's response to treatment, examination of patient, obtaining history from patient or surrogate, ordering and performing treatments and interventions, ordering and review of laboratory studies, ordering and review of radiographic studies, pulse oximetry and re-evaluation of patient's condition.

## 2023-05-27 NOTE — Progress Notes (Signed)
ANTICOAGULATION CONSULT NOTE - Initial Consult  Pharmacy Consult for Heparin Indication:  STEMI  Allergies  Allergen Reactions   Prednisone Nausea Only    insomnia   Codeine Nausea And Vomiting   Levofloxacin Diarrhea    Levaquin    Patient Measurements: Height: 4\' 11"  (149.9 cm) Weight: 52.2 kg (115 lb) IBW/kg (Calculated) : 43.2 Heparin Dosing Weight: 52.2 kg  Vital Signs: BP: 100/66 (01/19 1115) Pulse Rate: 69 (01/19 1115)  Labs: Recent Labs    05/27/23 1050 05/27/23 1053  HGB 15.8* 16.0*  HCT 50.0* 47.0*  PLT 368  --   APTT 25  --   LABPROT 17.2*  --   INR 1.4*  --   CREATININE  --  0.80    Estimated Creatinine Clearance: 31.8 mL/min (by C-G formula based on SCr of 0.8 mg/dL).   Medical History: Past Medical History:  Diagnosis Date   ASHD (arteriosclerotic heart disease) 11/16/2016   CAD (coronary artery disease)    Dyslipidemia 11/16/2016   Non-ST elevation (NSTEMI) myocardial infarction (HCC) 11/16/2016    Medications:  (Not in a hospital admission)  Scheduled:  Infusions:  PRN:   Assessment: 37 yof with a history of dyslipidemia, CAD. Patient is presenting as a code stroke activation from EMS. Upon arrival, story became concerning for MI as patient complaining of chest pain prior to receiving compressions from family. ED MD review of pre-hospital ECG resulted in STEMI activation. Ultimately, pt deemed not a candidate for cath lab intervention. Heparin per pharmacy consult placed for  STEMI .   Neurology has cancelled code stroke and family has decided to pursue comfort care.  Patient is not on anticoagulation prior to arrival.  Hgb 16; plt 368 PT/INR 17.2/1.4  Goal of Therapy:  Heparin level 0.3-0.7 units/ml Monitor platelets by anticoagulation protocol: Yes   Plan:  Give IV heparin 2000 units bolus x 1 Start heparin infusion at 650 units/hr Check anti-Xa level in 8 hours and daily while on heparin Continue to monitor H&H and  platelets  Delmar Landau, PharmD, BCPS 05/27/2023 11:39 AM ED Clinical Pharmacist -  [phone]

## 2023-05-27 NOTE — ED Provider Notes (Signed)
[place] EMERGENCY DEPARTMENT AT [facility] Provider Note   CSN: [id number] Arrival date & time: 05/27/23  1044  An emergency department physician performed an initial assessment on this suspected stroke patient at 32 (EDP evaluated patient- code STEMI).  History  Chief Complaint  Patient presents with   Code Stroke   Code STEMI    [address] Shannon Flowers is a 89 y.o. female with history of CAD/NSTEMI, dyslipidemia.  EMS was called out for a cardiac arrest.  When EMS arrived patient's niece was performing CPR.  EMS found patient with a pulse but somewhat altered.  Patient's niece reported that she had been complaining of chest pain earlier in the day.  EMS was concerned for potential stroke and activated a stroke code in the field.  On arrival to the ED, patient is noted to be diaphoretic, intermittently dry heaving.  She is responsive to questions and endorse chest pain and nausea. Denies taking blood thinner. She states she thinks she passed out earlier.  No focal neurodeficits are noted on arrival. EMS rhythm strip demonstrates inferior STEMI pattern.  Patient immediately made code STEMI.  Further history limited by acuity of situation.   Past Medical History:  Diagnosis Date   ASHD (arteriosclerotic heart disease) 11/16/2016   CAD (coronary artery disease)    Dyslipidemia 11/16/2016   Non-ST elevation (NSTEMI) myocardial infarction (HCC) 11/16/2016       Home Medications Prior to Admission medications   Medication Sig Start Date End Date Taking? Authorizing Provider  aspirin 81 MG chewable tablet Chew 81 mg by mouth daily.  04/01/16   [provider]  carvedilol (COREG) 6.25 MG tablet TAKE 1 TABLET BY MOUTH TWICE DAILY. 01/23/23   Rollene Rotunda, MD  ezetimibe (ZETIA) 10 MG tablet Take 10 mg by mouth daily. 05/04/17   [provider]  hydrALAZINE (APRESOLINE) 10 MG tablet Take 10 mg every 12 hours if needed for Systolic Blood Pressure  greater than 180 07/25/21   Rollene Rotunda, MD  isosorbide mononitrate (IMDUR) 120 MG 24 hr tablet TAKE 1 TABLET BY MOUTH ONCE DAILY. 12/22/22   Rollene Rotunda, MD  nitroGLYCERIN (NITROSTAT) 0.4 MG SL tablet Place 1 tablet (0.4 mg total) under the tongue every 5 (five) minutes as needed for chest pain. 03/03/22   Rollene Rotunda, MD      Allergies    Prednisone, Codeine, and Levofloxacin    Review of Systems   Review of Systems A 10 point review of systems was performed and is negative unless otherwise reported in HPI.  Physical Exam Updated Vital Signs BP (!) 78/61 (BP Location: Right Arm)   Pulse (!) 54   Temp (!) 96.4 F (35.8 C) (Rectal)   Resp (!) 30   Ht 4\' 11"  (1.499 m)   Wt 52.2 kg   SpO2 96%   BMI 23.23 kg/m  Physical Exam General: Acutely ill appearing elderly female, lying in bed.  HEENT: PERRLA, Sclera anicteric, MMM, trachea midline.  Cardiology: RRR, no murmurs/rubs/gallops. BL radial and DP pulses equal bilaterally.  Resp: Normal respiratory rate and effort. CTAB, no wheezes, rhonchi, crackles.  Abd: Soft, non-tender, non-distended. No rebound tenderness or guarding.  GU: Deferred. MSK: No peripheral edema or signs of trauma. Extremities without deformity or TTP.  Skin: Warm, diaphoretic Back: No CVA tenderness Neuro: Somewhat lethargic but responsive to voice, answers most questions, CNs II-XII grossly intact. MAEs. Sensation grossly intact.   ED Results / Procedures / Treatments   Labs (all labs  ordered are listed, but only abnormal results are displayed) Labs Reviewed  PROTIME-INR - Abnormal; Notable for the following components:      Result Value   Prothrombin Time 17.2 (*)    INR 1.4 (*)    All other components within normal limits  CBC - Abnormal; Notable for the following components:   Hemoglobin 15.8 (*)    HCT 50.0 (*)    MCV 100.2 (*)    All other components within normal limits  DIFFERENTIAL - Abnormal; Notable for the following components:    Lymphs Abs 4.6 (*)    All other components within normal limits  COMPREHENSIVE METABOLIC PANEL - Abnormal; Notable for the following components:   Chloride 112 (*)    CO2 15 (*)    Glucose, Bld 193 (*)    Calcium 7.7 (*)    Total Protein 5.0 (*)    Albumin 2.8 (*)    All other components within normal limits  BRAIN NATRIURETIC PEPTIDE - Abnormal; Notable for the following components:   B Natriuretic Peptide 355.3 (*)    All other components within normal limits  I-STAT CHEM 8, ED - Abnormal; Notable for the following components:   Chloride 112 (*)    Glucose, Bld 193 (*)    Calcium, Ion 0.98 (*)    TCO2 16 (*)    Hemoglobin 16.0 (*)    HCT 47.0 (*)    All other components within normal limits  CBG MONITORING, ED - Abnormal; Notable for the following components:   Glucose-Capillary 183 (*)    All other components within normal limits  TROPONIN I (HIGH SENSITIVITY) - Abnormal; Notable for the following components:   Troponin I (High Sensitivity) 18 (*)    All other components within normal limits  APTT  ETHANOL    EKG EKG Interpretation Date/Time:  Sunday May 27 2023 10:52:18 EST Ventricular Rate:  81 PR Interval:  268 QRS Duration:  97 QT Interval:  390 QTC Calculation: 453 R Axis:   -35  Text Interpretation: Inferior STEMI pattern Sinus rhythm Prolonged PR interval Probable left atrial enlargement LVH with secondary repolarization abnormality Inferior infarct, acute (RCA) Anterior infarct, old Probable RV involvement, suggest recording right precordial leads Baseline wander in lead(s) V1 >>> Acute MI <<< Confirmed by Vivi Barrack [phone]) on 05/27/2023 11:02:29 AM  Radiology DG Chest Portable 1 View Result Date: 05/27/2023 CLINICAL DATA:  89 year old female with history of chest pain and shortness of breath. EXAM: PORTABLE CHEST 1 VIEW COMPARISON:  Chest x-ray 02/19/2021. FINDINGS: Chronic elevation of the right hemidiaphragm. Extensive interstitial prominence and  widespread peribronchial cuffing throughout the lungs bilaterally (left-greater-than-right). No confluent consolidative airspace disease. No pleural effusions. No pneumothorax. No evidence of pulmonary edema. Heart size is normal. Upper mediastinal contours are within normal limits. Atherosclerotic calcifications in the thoracic aorta. IMPRESSION: 1. The appearance of the chest is concerning for bronchitis and possible developing bronchopneumonia, as above. 2. Aortic atherosclerosis. Electronically Signed   By: Trudie Reed M.D.   On: 05/27/2023 11:55    Procedures .Critical Care  Performed by: Loetta Rough, MD Authorized by: Loetta Rough, MD   Critical care provider statement:    Critical care time (minutes):  50   Critical care was necessary to treat or prevent imminent or life-threatening deterioration of the following conditions:  Cardiac failure, shock and respiratory failure   Critical care was time spent personally by me on the following activities:  Development of treatment plan with patient or  surrogate, discussions with consultants, evaluation of patient's response to treatment, examination of patient, ordering and review of laboratory studies, ordering and review of radiographic studies, ordering and performing treatments and interventions, pulse oximetry, re-evaluation of patient's condition, review of old charts and obtaining history from patient or surrogate   Care discussed with: admitting provider       Medications Ordered in ED Medications  acetaminophen (TYLENOL) tablet 650 mg (has no administration in time range)    Or  acetaminophen (TYLENOL) suppository 650 mg (has no administration in time range)  glycopyrrolate (ROBINUL) tablet 1 mg (has no administration in time range)    Or  glycopyrrolate (ROBINUL) injection 0.2 mg (has no administration in time range)    Or  glycopyrrolate (ROBINUL) injection 0.2 mg (has no administration in time range)  polyvinyl  alcohol (LIQUIFILM TEARS) 1.4 % ophthalmic solution 1 drop (has no administration in time range)  albuterol (PROVENTIL) (2.5 MG/3ML) 0.083% nebulizer solution 2.5 mg (has no administration in time range)  ondansetron (ZOFRAN) injection 4 mg (has no administration in time range)  sodium chloride flush (NS) 0.9 % injection 3 mL (3 mLs Intravenous Given 05/27/23 1118)  ondansetron (ZOFRAN) injection 4 mg (4 mg Intravenous Given 05/27/23 1102)  aspirin suppository 300 mg (300 mg Rectal Given 05/27/23 1114)  heparin bolus via infusion 2,000 Units (2,000 Units Intravenous Bolus from Bag 05/27/23 1153)  lactated ringers bolus 1,000 mL (1,000 mLs Intravenous New Bag/Given 05/27/23 1208)    ED Course/ Medical Decision Making/ A&P                          Medical Decision Making Amount and/or Complexity of Data Reviewed Labs: ordered. Decision-making details documented in ED Course. Radiology: ordered.  Risk OTC drugs. Prescription drug management. Decision regarding hospitalization.    This patient presents to the ED for concern of LOC/CP, this involves an extensive number of treatment options, and is a complaint that carries with it a high risk of complications and morbidity.  I considered the following differential and admission for this acute, potentially life threatening condition.   MDM:    Patient immediately moved to room and inferior STEMI pattern noted on ED EKG.  Code STEMI activated immediately.  Patient has aspirin listed on home meds and provide an additional 243 mg p.o. aspirin.  Patient is given Zofran for nausea and placed on the [place] and pads.  Discussed with neurology who is at bedside performing stroke code and we decided to call out the stroke code believing this is likely more cardiac in origin. Patient now denying any chest pain. She is very lethargic and arouses to voice/tactile stimulation.  Clinical Course as of 05/27/23 1524  Sun May 27, 2023  1105 CBC(!) Unremarkable  in the context of this patient's presentation  [HN]  1115 Cardiology Dr. Junious Dresser at bedside recommending medical management.  Will recommend cardiology admission into the ICU.  Patient's family at bedside stating that she does not want intubation or any heroic measures to save her life. Cards recommending plavix but patient not currently awake enough to swallow, likely. Received ASA rectally.  [HN]  1127 BP: 100/66 Pressure improved after 1L IVF [HN]  1204 BP(!): 70/59 Giving a second liter of fluid resuscitation [HN]  1209 Dr. Lynnette Caffey clarified that he will be admitting to [facility]. [HN]  1319 Patient had episode of bradycardia and actually an episode of PEA.  Witnessed at bedside.  Patient became apneic and  briefly turned blue before taking another breath and pulse returned.  She is continuing to be hypotensive on fluid resuscitation.  Had long discussion with nieces at bedside.  Called patient's healthcare power of attorney Janey Greaser and had a discussion.  The cardiologist and I think that the patient's outcome even with full medical management is going to be poor.  Patient's healthcare power of attorney had a discussion with the patient just last week who stated that she would not want any extraordinary measures to keep her alive and that she would "just want to go."  Patient stated that she had a good long life and was ready to die.  HCPOA thinks that patient would want to be comfort care and family at bedside agrees.  Will discontinue heparin and make patient comfort care.  Dr. Lynnette Caffey notified. [HN]    Clinical Course User Index [HN] Loetta Rough, MD    Labs: I Ordered, and personally interpreted labs.  The pertinent results include:  those listed above  Imaging Studies ordered: I ordered imaging studies including CXR I independently visualized and interpreted imaging. I agree with the radiologist interpretation  Additional history obtained from EMS, chart review.    Cardiac  Monitoring: The patient was maintained on a cardiac monitor.  I personally viewed and interpreted the cardiac monitored which showed an underlying rhythm of: NSR  Reevaluation: After the interventions noted above, I reevaluated the patient and found that they have :stayed the same  Social Determinants of Health: Lives independently  Disposition:  Admitted to cardiology. After long discussion with family/HCPOA, patient made DNR and comfort care according to her well-described wishes. DC'd heparin and added comfort care orders.   Co morbidities that complicate the patient evaluation  Past Medical History:  Diagnosis Date   ASHD (arteriosclerotic heart disease) 11/16/2016   CAD (coronary artery disease)    Dyslipidemia 11/16/2016   Non-ST elevation (NSTEMI) myocardial infarction (HCC) 11/16/2016     Medicines Meds ordered this encounter  Medications   sodium chloride flush (NS) 0.9 % injection 3 mL   DISCONTD: aspirin chewable tablet 243 mg   ondansetron (ZOFRAN) injection 4 mg   aspirin suppository 300 mg   heparin bolus via infusion 2,000 Units   DISCONTD: heparin ADULT infusion 100 units/mL (25000 units/264mL)   lactated ringers bolus 1,000 mL   OR Linked Order Group    acetaminophen (TYLENOL) tablet 650 mg    acetaminophen (TYLENOL) suppository 650 mg   OR Linked Order Group    glycopyrrolate (ROBINUL) tablet 1 mg    glycopyrrolate (ROBINUL) injection 0.2 mg    glycopyrrolate (ROBINUL) injection 0.2 mg   polyvinyl alcohol (LIQUIFILM TEARS) 1.4 % ophthalmic solution 1 drop   albuterol (PROVENTIL) (2.5 MG/3ML) 0.083% nebulizer solution 2.5 mg   ondansetron (ZOFRAN) injection 4 mg    I have reviewed the patients home medicines and have made adjustments as needed  Problem List / ED Course: Problem List Items Addressed This Visit   None Visit Diagnoses       ST elevation myocardial infarction (STEMI) of inferior wall (HCC)    -  Primary   Relevant Medications   aspirin  suppository 300 mg (Completed)   heparin bolus via infusion 2,000 Units (Completed)     Syncope and collapse                       This note was created using dictation software, which may contain spelling or grammatical errors.  Loetta Rough, MD 05/27/23 [phone]

## 2023-05-27 NOTE — ED Notes (Signed)
Pt diaphoretic & denies CP at this time.

## 2023-05-27 NOTE — Code Documentation (Addendum)
Stroke Response Nurse Documentation Code Documentation  Shannon Flowers is a 89 y.o. female arriving to [facility]  via [facility] on 05/27/2023 with past medical hx of CAD, NSTEMI. On aspirin 81 mg daily. Code stroke was activated by EMS.   Patient from home where she was LKW at yesterday at 1730 and now complaining of sudden mental status change and unresponsive. Bystander chest compressions in progress upon EMS arrival. Pt had reported chest pain to her neighbor.   Pt noted to be pale, diaphoretic on arrival. EMS EKG concerning for STEMI. Code STEMI activated on patient arrival.   Care Plan: Code STEMI activated.Cardiac needs prioritized. Code stroke cancelled at 1140.  Bedside handoff with ED RN Idalia Needle.    Jaelynn Currier L Jackalynn Art  Rapid Response RN

## 2023-05-27 NOTE — ED Triage Notes (Signed)
Pt BIB [facility] from home where her family found her down & unconscious, family reports they started CPR on her at 0930. EMS arrived & she was altered, baseline is A/Ox4 & "walky-talky." She arrived as Code stroke d/t not following commands & being altered & then Code Stemi activated once finding out pt had prior CP before this event took place & arriving to ED diaphoretic. VSS for EMS initially 164/84, 100% RA, 84 bpm. Her pressure became soft & was 90/50 before arrival, not on thinners, Hx of heart attack, does have bil 18g PIV from EMS.

## 2023-05-28 DIAGNOSIS — I2111 ST elevation (STEMI) myocardial infarction involving right coronary artery: Secondary | ICD-10-CM | POA: Diagnosis not present

## 2023-05-28 DIAGNOSIS — I213 ST elevation (STEMI) myocardial infarction of unspecified site: Secondary | ICD-10-CM

## 2023-05-28 DIAGNOSIS — Z515 Encounter for palliative care: Secondary | ICD-10-CM

## 2023-05-28 DIAGNOSIS — R0602 Shortness of breath: Secondary | ICD-10-CM

## 2023-05-28 DIAGNOSIS — G9340 Encephalopathy, unspecified: Secondary | ICD-10-CM | POA: Insufficient documentation

## 2023-05-28 MED ORDER — LORAZEPAM 2 MG/ML IJ SOLN: 1.0000 mg | INTRAMUSCULAR | Status: DC | PRN

## 2023-05-28 NOTE — Plan of Care (Signed)

## 2023-05-28 NOTE — TOC Initial Note (Addendum)
Transition of Care [facility]) - Initial/Assessment Note    Patient Details  Name: Shannon Flowers MRN: [id number] Date of Birth: 1927-05-03  Transition of [facility]) CM/SW Contact:    Leone Haven, RN Phone Number: 05/28/2023, 4:34 PM  Clinical Narrative:                 From home alone, has PCP and insurance on file, states has no HH services in place at this time or DME at home.  States will need ambulance  transport  at [employer] and family is support system, states [facility] [place].  Pta self ambulatory.  NCM offered choice for home hospice son , Shannon Flowers chose [facility].  Shannon Flowers and her niece Rosalita Chessman will be the caregivers for patient.  Rosalita Chessman states she will be there with the patient at all times.  She does not need any DME .  Cheri with [facility] spoke with son to explain the process and the difference between Palliative and Hospice.   Expected Discharge Plan: Home w Hospice Care Barriers to Discharge: No Barriers Identified   Patient Goals and CMS Choice Patient states their goals for this hospitalization and ongoing recovery are:: return home CMS Medicare.gov Compare Post Acute Care list provided to:: Patient Represenative (must comment) Choice offered to / list presented to : Adult Children      Expected Discharge Plan and Services In-house Referral: NA Discharge Planning Services: CM Consult Post Acute Care Choice: Hospice Living arrangements for the past 2 months: Single Family Home                 DME Arranged: N/A DME Agency: NA       HH Arranged: NA          Prior Living Arrangements/Services Living arrangements for the past 2 months: Single Family Home Lives with:: Self Patient language and need for interpreter reviewed:: Yes Do you feel safe going back to the place where you live?: Yes      Need for Family Participation in Patient Care: Yes (Comment) Care giver support system in place?: Yes  (comment)   Criminal Activity/Legal Involvement Pertinent to Current Situation/Hospitalization: No - Comment as needed  Activities of Daily Living      Permission Sought/Granted Permission sought to share information with : Case Manager Permission granted to share information with : Yes, Verbal Permission Granted              Emotional Assessment Appearance:: Appears stated age Attitude/Demeanor/Rapport: Engaged Affect (typically observed): Appropriate Orientation: : Oriented to Self, Oriented to Place, Oriented to  Time, Oriented to Situation Alcohol / Substance Use: Not Applicable Psych Involvement: No (comment)  Admission diagnosis:  Syncope and collapse [R55] ST elevation myocardial infarction (STEMI) of inferior wall (HCC) [I21.19] STEMI (ST elevation myocardial infarction) (HCC) [I21.3] Patient Active Problem List   Diagnosis Date Noted   End of life care 05/28/2023   Encephalopathy 05/28/2023   STEMI (ST elevation myocardial infarction) (HCC) 05/27/2023   Unstable angina (HCC) 03/19/2022   Essential hypertension 09/25/2021   Nonspecific abnormal electrocardiogram (ECG) (EKG) 07/24/2021   Hypotension 11/20/2016   Coronary artery disease involving native coronary artery of native heart with unstable angina pectoris (HCC) 11/16/2016   Dyslipidemia 11/16/2016   Old MI (myocardial infarction) 11/16/2016   PCP:  Jerrye Bushy, FNP Pharmacy:   [facility] - [place], [place] - [address]  [address] [place] [address] Phone: [phone] Fax: [phone]     Social Drivers of Health (SDOH) Social History: SDOH Screenings   Food Insecurity: No Food Insecurity (05/27/2023)  Housing: Low Risk  (05/27/2023)  Transportation Needs: No Transportation Needs (05/27/2023)  Utilities: Not At Risk (05/27/2023)  Social Connections: Unknown (05/27/2023)  Tobacco Use: Low Risk  (05/27/2023)   SDOH Interventions:     Readmission Risk  Interventions     No data to display

## 2023-05-28 NOTE — Progress Notes (Addendum)
   Telephone call from the CM Letha Cape who requested that I speak with the pt and her son Gery Pray regarding hospice services and PC services in the home. We discussed the pt's condition and illness after I reviewed the chart. We discussed how Hospice services would help with the pt in the home and also the difference between hospice care and PC services. The pt son Gery Pray is going to have further discussion with the pt and they will update the hospital or me with how they feel the pt would best be served.    400pm Return call from the pt's son and after further discussion with his mother they have elected hospice services for pt to go home with.  We will work the pt up and be able to support at home once d/c is ready.   Norm Parcel RN [phone]

## 2023-05-28 NOTE — Progress Notes (Signed)
  Cardiology Progress Note  Patient ID: Shannon Flowers MRN: [id number] DOB: 19-Sep-1926 Date of Encounter: 05/28/2023 Primary Cardiologist: Rollene Rotunda, MD  Subjective   Chief Complaint: Cough  HPI: Admitted with inferior STEMI.  Plan is for comfort care measures.  Reports cough.  Requests albuterol inhaler.  No chest pain.  ROS:  All other ROS reviewed and negative. Pertinent positives noted in the HPI.     ECG  The most recent ECG shows SR 56 bpm, Q wave inferior with ST elevation, PVC, which I personally reviewed.   Physical Exam   Vitals:   05/27/23 1250 05/27/23 1300 05/27/23 1345 05/27/23 1500  BP: (!) 77/65 (!) 69/57 96/80 (!) 78/61  Pulse: (!) 51 (!) 49 (!) 54 (!) 54  Resp: (!) 26 (!) 25 (!) 23 (!) 30  Temp:      TempSrc:      SpO2: 100% 99% 99% 96%  Weight:      Height:       No intake or output data in the 24 hours ending 05/28/23 0853     05/27/2023   11:15 AM 09/18/2022   11:25 AM 03/20/2022   11:35 AM  Last 3 Weights  Weight (lbs) 115 lb 116 lb 12.8 oz 117 lb  Weight (kg) 52.164 kg 52.98 kg 53.071 kg    Body mass index is 23.23 kg/m.  General: ill-appearing  Head: Atraumatic, normal size  Eyes: PEERLA, EOMI  Neck: Supple, no JVD Endocrine: No thryomegaly Cardiac: Normal S1, S2; RRR; no murmurs, rubs, or gallops Lungs: crackles bilaterally  Abd: Soft, nontender, no hepatomegaly  Ext: No edema, pulses 2+ Musculoskeletal: No deformities, BUE and BLE strength normal and equal Skin: Warm and dry, no rashes   Neuro: Alert and oriented to person, place, time, and situation, CNII-XII grossly intact, no focal deficits  Psych: Normal mood and affect   Cardiac Studies  Left heart catheterization ([facility], 03/31/2016) 40% left main 95% diffusely diseased LAD Less than 30% left circumflex 95% heavily calcified RCA Medical management recommended  Patient Profile  Shannon Flowers is a 89 y.o. female with diffuse CAD,  hypertension, hyperlipidemia who was admitted on 05/27/2023 for inferior STEMI.  Due to prior history of CAD that is not amendable to CABG or PCI comfort care measures have been pursued.  Assessment & Plan   # Inferior STEMI # Comfort care measures # Multivessel CAD # Encephalopathy -Patient with a known history of severe multivessel CAD from 2017 and has been managed medically.  She did not have any options for revascularization at that time. -Now admitted with what appears to be inferior STEMI after being found down with decreased level of consciousness -Family discussion yesterday and decision was pursue comfort measures.  Will consult palliative care to likely facilitate transfer to hospice.  They can also help with comfort care orders. -ok for diet  # Cough # SOB -albuterol PRN  # FEN -no IVF -code: DNR comfort -dvt ppx: none needed -diet: heart healthy  -dispo: palliative consult. Anticipate dc with hospice.       For questions or updates, please contact [facility] Please consult [email or web address] for contact info under    Signed, Gerri Spore T. Flora Lipps, MD, [facility]  [facility]  05/28/2023 8:53 AM

## 2023-05-29 DIAGNOSIS — I2111 ST elevation (STEMI) myocardial infarction involving right coronary artery: Secondary | ICD-10-CM | POA: Diagnosis not present

## 2023-05-29 MED ORDER — ALBUTEROL SULFATE (2.5 MG/3ML) 0.083% IN NEBU: 2.5000 mg | INHALATION_SOLUTION | RESPIRATORY_TRACT | 12 refills | Status: DC | PRN

## 2023-05-29 NOTE — TOC Transition Note (Addendum)
Transition of Care [facility]) - Discharge Note   Patient Details  Name: Shannon Flowers MRN: [id number] Date of Birth: August 25, 1926  [facility] CM/SW Contact:  Leone Haven, RN Phone Number: 05/29/2023, 11:46 AM   Clinical Narrative:    For dc today, Cheri with [facility] will order nebulizer machine.  PTAR has been scheduled.   Address confirmed.   Final next level of care: Home w Hospice Care Barriers to Discharge: No Barriers Identified   Patient Goals and CMS Choice Patient states their goals for this hospitalization and ongoing recovery are:: return home CMS Medicare.gov Compare Post Acute Care list provided to:: Patient Represenative (must comment) Choice offered to / list presented to : Adult Children      Discharge Placement                       Discharge Plan and Services Additional resources added to the After Visit Summary for   In-house Referral: NA Discharge Planning Services: CM Consult Post Acute Care Choice: Hospice          DME Arranged: N/A DME Agency: NA       HH Arranged: NA          Social Drivers of Health (SDOH) Interventions SDOH Screenings   Food Insecurity: No Food Insecurity (05/27/2023)  Housing: Low Risk  (05/27/2023)  Transportation Needs: No Transportation Needs (05/27/2023)  Utilities: Not At Risk (05/27/2023)  Social Connections: Unknown (05/27/2023)  Tobacco Use: Low Risk  (05/27/2023)     Readmission Risk Interventions     No data to display

## 2023-05-29 NOTE — Progress Notes (Signed)
  Cardiology Progress Note  Patient ID: Shannon Flowers MRN: [id number] DOB: 1927/02/13 Date of Encounter: 05/29/2023 Primary Cardiologist: Rollene Rotunda, MD  Subjective   Chief Complaint: cough   HPI: Plan is home with hospice.  Awaiting hospice unit to approve everything.  ROS:  All other ROS reviewed and negative. Pertinent positives noted in the HPI.     Physical Exam   Vitals:   05/27/23 1500 05/28/23 1135 05/28/23 2019 05/29/23 0827  BP: (!) 78/61 (!) 147/81 (!) 182/93 (!) 178/88  Pulse: (!) 54 (!) 59 66 66  Resp: (!) 30 (!) 25 20 20   Temp:  97.9 F (36.6 C) 97.9 F (36.6 C) 97.6 F (36.4 C)  TempSrc:  Axillary Axillary Oral  SpO2: 96% 94% 95% 98%  Weight:      Height:        Intake/Output Summary (Last 24 hours) at 05/29/2023 1035 Last data filed at 05/28/2023 1800 Gross per 24 hour  Intake 240 ml  Output 300 ml  Net -60 ml       05/27/2023   11:15 AM 09/18/2022   11:25 AM 03/20/2022   11:35 AM  Last 3 Weights  Weight (lbs) 115 lb 116 lb 12.8 oz 117 lb  Weight (kg) 52.164 kg 52.98 kg 53.071 kg    Body mass index is 23.23 kg/m.  General: Ill-appearing Head: Atraumatic, normal size  Eyes: PEERLA, EOMI  Neck: Supple, no JVD Endocrine: No thryomegaly Cardiac: Normal S1, S2; RRR; no murmurs, rubs, or gallops Lungs: Diminished breath sounds bilaterally Abd: Soft, nontender, no hepatomegaly  Ext: No edema, pulses 2+ Musculoskeletal: No deformities, BUE and BLE strength normal and equal Skin: Warm and dry, no rashes   Neuro: Alert and oriented to person, place, time, and situation, CNII-XII grossly intact, no focal deficits  Psych: Normal mood and affect   Cardiac Studies  LHC 03/31/2016 Left heart catheterization ([facility], 03/31/2016) 40% left main 95% diffusely diseased LAD Less than 30% left circumflex 95% heavily calcified RCA Medical management recommended  Patient Profile  Shannon Flowers is a 89 y.o. female  with diffuse CAD, hypertension, hyperlipidemia who was admitted on 05/27/2023 for inferior STEMI.  Due to prior history of CAD that is not amendable to CABG or PCI comfort care measures have been pursued.   Assessment & Plan   # Inferior STEMI # Comfort care measures # Multivessel CAD # Encephalopathy -Patient with known history of severe multivessel CAD from 2017 and has been managed medically. -Admitted with STEMI. -Not a candidate for invasive angiography per interventional cardiology.  Comfort care measures have been pursued. -Plan is for discharge home with hospice.  Awaiting final placement with hospice team. -Okay for diet.  No need for any aggressive care.  # Cough # Shortness of breath -Albuterol as needed  # FEN -No intravenous fluids -Code: DNR comfort -DVT PPx: Not needed -Diet: General -Disposition: Home with hospice      For questions or updates, please contact [facility] Please consult [email or web address] for contact info under        Signed, Gerri Spore T. Flora Lipps, MD, [facility]  [facility]  05/29/2023 10:35 AM

## 2023-05-29 NOTE — Discharge Summary (Signed)
Discharge Summary    Patient ID: Shannon Flowers MRN: [id number]; DOB: 06-Jun-1926  Admit date: 05/27/2023 Discharge date: 05/29/2023  PCP:  Jerrye Bushy, FNP   [facility] Providers Cardiologist:  Rollene Rotunda, MD        Discharge Diagnoses    Principal Problem:   STEMI (ST elevation myocardial infarction) [facility]) Active Problems:   Coronary artery disease involving native coronary artery of native heart with unstable angina pectoris (HCC)   Dyslipidemia   Essential hypertension   End of life care   Encephalopathy    Diagnostic Studies/Procedures    None _____________   History of Present Illness     Shannon Flowers is a 89 y.o. female with a history of multivessel CAD with NSTEMI in 03/2016 (not felt to be a candidate for PCI/ CABG and medically treated), hypertension, and  hyperlipidemia who was admitted on 05/27/2023 for a STEMI after presenting with sudden onset of chest pain and altered mental status.  Patient has a history of extensive and diffuse CAD. She was admitted at an [facility] in 03/2016 for NSTEMI after presenting with chest pain. Cath at that time showed severe and diffuse CAD that was not felt to be amenable to revascularization. Medical therapy was recommended. She was last seen by Dr. Antoine Poche in 09/2022 at which time she was doing well with no chest pain or new/ worsening shortness of breath.  She was in her usual state of health until 05/27/2023 when she called her niece around 9:30am complaining of chest pain. Her niece contacted another relative who is her neighbor and when this relative arrive, she was on the floor with decreased level of consciousness but was complaining of chest pain as well as nausea/ dry heaves. EMS was called and instructed patient to do CPR until they arrived. Although, relative says patient never lost a pulse.  EMS was concerned for a stroke so a CODE STROKE was called in the field.  However, EMS rhythm strips also demonstrates an inferior STEMI and a CODE STEMI was called upon arrival to the ED. Patient was seen by Cardiology and Neurology but family requested patient be made comfort care so no additional evaluation was performed.  Hospital Course     Consultants: Neurology   Patient was admitted on 05/27/2023 as for an inferior STEMI after presenting with chest pain and altered mental status. Initial high-sensitivity troponin was only minimally elevated at 18. There was also concern for potential stroke on admission due to altered mental status and CODE STROKE was called in the field. However, family requested that patient be made comfort care. Therefore, no additional work-up (including Echo, LHC, head CT) was performed. She was initially started on IV Heparin but this was quickly stopped due to plans for comfort care. She was initially hypotensive and was treated with IV fluids. However, BP has since improved and she is now hypertensive. Family agreed to home hospice and Palliative Care was consulted. Palliative Care has not formally seen yet, but Case Manager was able to help arrange home hospice with [facility]. Therefore, OK for discharge. She is not complaining of any chest pain at this time. Given she is going home with hospice on comfort care, all home medications (aspirin and antihypertensives) have been stopped. Will provide albuterol nebulizer for cough and shortness of breath. Otherwise, management per hospice.     Did the patient have an acute coronary syndrome (MI, NSTEMI, STEMI, etc)  this admission?:  Yes                               AHA/ACC ACS Clinical Performance & Quality Measures: Aspirin prescribed? - No - comfort care (being discharged to Hospice) ADP Receptor Inhibitor (Plavix/Clopidogrel, Brilinta/Ticagrelor or Effient/Prasugrel) prescribed (includes medically managed patients)? - No - comfort care (being discharged to Hospice) Beta Blocker  prescribed? - No - comfort care (being discharged to Hospice) High Intensity Statin (Lipitor 40-80mg  or Crestor 20-40mg ) prescribed? No - comfort care (being discharged to Hospice) EF assessed during THIS hospitalization? - No - comfort care (being discharged to Hospice) For EF <40%, was ACEI/ARB prescribed? - No - comfort care (being discharged to Hospice) For EF <40%, Aldosterone Antagonist (Spironolactone or Eplerenone) prescribed? - No - comfort care (being discharged to Hospice) Cardiac Rehab Phase II ordered (including medically managed patients)? - No - comfort care (being discharged to Hospice)  _____________  Discharge Vitals Blood pressure (!) 178/88, pulse 66, temperature 97.6 F (36.4 C), temperature source Oral, resp. rate 20, height 4\' 11"  (1.499 m), weight 52.2 kg, SpO2 98%.  Filed Weights   05/27/23 1115  Weight: 52.2 kg    Labs & Radiologic Studies    CBC Recent Labs    05/27/23 1050 05/27/23 1053  WBC 10.2  --   NEUTROABS 4.7  --   HGB 15.8* 16.0*  HCT 50.0* 47.0*  MCV 100.2*  --   PLT 368  --    Basic Metabolic Panel Recent Labs    91/47/82 1050 05/27/23 1053  NA 139 139  K 3.7 3.7  CL 112* 112*  CO2 15*  --   GLUCOSE 193* 193*  BUN 20 22  CREATININE 0.86 0.80  CALCIUM 7.7*  --    Liver Function Tests Recent Labs    05/27/23 1050  AST 25  ALT 12  ALKPHOS 41  BILITOT 1.1  PROT 5.0*  ALBUMIN 2.8*   No results for input(s): "LIPASE", "AMYLASE" in the last 72 hours. High Sensitivity Troponin:   Recent Labs  Lab 05/27/23 1050  TROPONINIHS 18*    BNP Invalid input(s): "POCBNP" D-Dimer No results for input(s): "DDIMER" in the last 72 hours. Hemoglobin A1C No results for input(s): "HGBA1C" in the last 72 hours. Fasting Lipid Panel No results for input(s): "CHOL", "HDL", "LDLCALC", "TRIG", "CHOLHDL", "LDLDIRECT" in the last 72 hours. Thyroid Function Tests No results for input(s): "TSH", "T4TOTAL", "T3FREE", "THYROIDAB" in the last 72  hours.  Invalid input(s): "FREET3" _____________  DG Chest Portable 1 View Result Date: 05/27/2023 CLINICAL DATA:  89 year old female with history of chest pain and shortness of breath. EXAM: PORTABLE CHEST 1 VIEW COMPARISON:  Chest x-ray 02/19/2021. FINDINGS: Chronic elevation of the right hemidiaphragm. Extensive interstitial prominence and widespread peribronchial cuffing throughout the lungs bilaterally (left-greater-than-right). No confluent consolidative airspace disease. No pleural effusions. No pneumothorax. No evidence of pulmonary edema. Heart size is normal. Upper mediastinal contours are within normal limits. Atherosclerotic calcifications in the thoracic aorta. IMPRESSION: 1. The appearance of the chest is concerning for bronchitis and possible developing bronchopneumonia, as above. 2. Aortic atherosclerosis. Electronically Signed   By: Trudie Reed M.D.   On: 05/27/2023 11:55   Disposition   Patient is being discharged home today with home hospice.  Follow-up Plans & Appointments     Discharge Instructions     For home use only DME Nebulizer machine   Complete by: As directed  Patient needs a nebulizer to treat with the following condition: Shortness of breath   Length of Need: 6 Months   Additional equipment included:  Administration kit Filter          Discharge Medications   Allergies as of 05/29/2023       Reactions   Prednisone Nausea Only   insomnia   Codeine Nausea And Vomiting   Levofloxacin Diarrhea   Levaquin        Medication List     STOP taking these medications    aspirin 81 MG chewable tablet   carvedilol 6.25 MG tablet Commonly known as: COREG   ezetimibe 10 MG tablet Commonly known as: ZETIA   hydrALAZINE 10 MG tablet Commonly known as: APRESOLINE   isosorbide mononitrate 120 MG 24 hr tablet Commonly known as: IMDUR   nitroGLYCERIN 0.4 MG SL tablet Commonly known as: NITROSTAT       TAKE these medications     albuterol (2.5 MG/3ML) 0.083% nebulizer solution Commonly known as: PROVENTIL Take 3 mLs (2.5 mg total) by nebulization every 2 (two) hours as needed for wheezing.               Durable Medical Equipment  (From admission, onward)           Start     Ordered   05/29/23 0000  For home use only DME Nebulizer machine       Question Answer Comment  Patient needs a nebulizer to treat with the following condition Shortness of breath   Length of Need 6 Months   Additional equipment included Administration kit   Additional equipment included Filter      05/29/23 1108               Outstanding Labs/Studies   N/A  Duration of Discharge Encounter: APP Time: 30 minutes   Signed, Corrin Parker, PA-C 05/29/2023, 11:34 AM

## 2023-05-29 NOTE — Progress Notes (Signed)
Palliative:  I spoke with Shannon Demark, PA cardiology. She notes that patient and family have elected comfort care and desiring home with hospice. Unfortunately, I am unable to see at this time. Directed TOC to offer hospice choice to begin process to get home with hospice as desired.   No charge  Yong Channel, NP Palliative Medicine Team Pager [phone] (Please see [email or web address] for schedule) Team Phone [phone]

## 2023-10-07 DEATH — deceased
